# Patient Record
Sex: Female | Born: 1990 | Race: White | Hispanic: No | Marital: Single | Smoking: Never smoker
Health system: Southern US, Community
[De-identification: ages and names within clinical notes are randomized; demographics above are authoritative.]

## PROBLEM LIST (undated history)

## (undated) DIAGNOSIS — F419 Anxiety disorder, unspecified: Secondary | ICD-10-CM

## (undated) DIAGNOSIS — F32A Depression, unspecified: Secondary | ICD-10-CM

## (undated) HISTORY — PX: TONSILLECTOMY: SUR1361

---
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
## 2015-08-13 ENCOUNTER — Encounter: Payer: Self-pay | Admitting: Emergency Medicine

## 2015-08-13 ENCOUNTER — Emergency Department: Payer: Self-pay

## 2015-08-13 ENCOUNTER — Emergency Department
Admission: EM | Admit: 2015-08-13 | Discharge: 2015-08-13 | Disposition: A | Discharge status: Home / Self Care | Payer: Self-pay | Attending: Emergency Medicine | Admitting: Emergency Medicine

## 2015-08-13 DIAGNOSIS — M545 Low back pain, unspecified: Secondary | ICD-10-CM

## 2015-08-13 DIAGNOSIS — E669 Obesity, unspecified: Secondary | ICD-10-CM | POA: Insufficient documentation

## 2015-08-13 DIAGNOSIS — Z3202 Encounter for pregnancy test, result negative: Secondary | ICD-10-CM | POA: Insufficient documentation

## 2015-08-13 LAB — POCT PREGNANCY, URINE
PREG TEST UR: POSITIVE — AB
Preg Test, Ur: NEGATIVE

## 2015-08-13 MED ORDER — ORPHENADRINE CITRATE 30 MG/ML IJ SOLN
60.0000 mg | Freq: Two times a day (BID) | INTRAMUSCULAR | Status: DC
  Administered 2015-08-13: 60 mg via INTRAMUSCULAR
  Filled 2015-08-13: qty 2

## 2015-08-13 MED ORDER — CYCLOBENZAPRINE HCL 10 MG PO TABS: 10.0000 mg | ORAL_TABLET | Freq: Three times a day (TID) | ORAL | Status: DC | PRN

## 2015-08-13 MED ORDER — IBUPROFEN 800 MG PO TABS: 800.0000 mg | ORAL_TABLET | Freq: Three times a day (TID) | ORAL | Status: DC | PRN

## 2015-08-13 MED ORDER — HYDROMORPHONE HCL 1 MG/ML IJ SOLN
1.0000 mg | Freq: Once | INTRAMUSCULAR | Status: AC
  Administered 2015-08-13: 1 mg via INTRAMUSCULAR
  Filled 2015-08-13: qty 1

## 2015-08-13 NOTE — ED Notes (Signed)
NAD noted at time of D/C. Pt taken to the lobby via wheelchair by her mother. Pt denies comments/concerns at this time.

## 2015-08-13 NOTE — ED Notes (Signed)
Pt here for low back pain that started yesterday. Denies trauma.

## 2015-08-13 NOTE — ED Notes (Signed)
preg test on patient , test was neg.placed information in machine, machine turns off before completing  In system reads pos. Which is not true, so I Christy Robinson re-done test again and re-enter information once again, notified Nurse Tiburcio Bash. And Ron P.A.

## 2015-08-13 NOTE — ED Provider Notes (Signed)
[facility] Emergency Department Provider Note  ____________________________________________  Time seen: Approximately 7:18 AM  I have reviewed the triage vital signs and the nursing notes.   HISTORY  Chief Complaint Back Pain    HPI Christy Robinson is a 24 y.o. female complaining of acute onset of low back pain since yesterday. Patient stated no provocative activities caused this pain. Patient states she awakened with the pain. Patient denies any bladder or bowel dysfunction. Patient last visit. Finish 2 days ago. No palliative measures taken for this complaint.Patient rating the pain as a 6/10.   No past medical history on file.  There are no active problems to display for this patient.   History reviewed. No pertinent past surgical history.  Current Outpatient Rx  Name  Route  Sig  Dispense  Refill  . cyclobenzaprine (FLEXERIL) 10 MG tablet   Oral   Take 1 tablet (10 mg total) by mouth every 8 (eight) hours as needed for muscle spasms.   15 tablet   0   . ibuprofen (ADVIL,MOTRIN) 800 MG tablet   Oral   Take 1 tablet (800 mg total) by mouth every 8 (eight) hours as needed for moderate pain.   15 tablet   0     Allergies Augmentin  No family history on file.  Social History Social History  Substance Use Topics  . Smoking status: Never Smoker   . Smokeless tobacco: None  . Alcohol Use: None    Review of Systems Constitutional: No fever/chills Eyes: No visual changes. ENT: No sore throat. Cardiovascular: Denies chest pain. Respiratory: Denies shortness of breath. Gastrointestinal: No abdominal pain.  No nausea, no vomiting.  No diarrhea.  No constipation. Genitourinary: Negative for dysuria. Musculoskeletal: Positive back pain. Skin: Negative for rash. Neurological: Negative for headaches, focal weakness or numbness. Endocrine:Obesity Hematological/Lymphatic: Allergic/Immunilogical: Augmentin  10-point ROS otherwise  negative.  ____________________________________________   PHYSICAL EXAM:  VITAL SIGNS: ED Triage Vitals  Enc Vitals Group     BP 08/13/15 0705 116/89 mmHg     Pulse Rate 08/13/15 0705 80     Resp 08/13/15 0705 16     Temp 08/13/15 0705 97.5 F (36.4 C)     Temp Source 08/13/15 0705 Oral     SpO2 08/13/15 0705 89 %     Weight 08/13/15 0705 250 lb (113.399 kg)     Height --      Head Cir --      Peak Flow --      Pain Score 08/13/15 0706 6     Pain Loc --      Pain Edu? --      Excl. in GC? --     Constitutional: Alert and oriented. Well appearing and in no acute distress. Eyes: Conjunctivae are normal. PERRL. EOMI. Head: Atraumatic. Nose: No congestion/rhinnorhea. Mouth/Throat: Mucous membranes are moist.  Oropharynx non-erythematous. Neck: No stridor.  No cervical spine tenderness to palpation. Hematological/Lymphatic/Immunilogical: No cervical lymphadenopathy. Cardiovascular: Normal rate, regular rhythm. Grossly normal heart sounds.  Good peripheral circulation. Respiratory: Normal respiratory effort.  No retractions. Lungs CTAB. Gastrointestinal: Soft and nontender. No distention. No abdominal bruits. No CVA tenderness. }Musculoskeletal: No lower extremity tenderness nor edema.  No joint effusions. Neurologic: No spinal deformity. No CVA gotten. Patient has moderate guarding at spinal processes L4 and 5. Patient decreased range of motion with flexion. . Skin:  Skin is warm, dry and intact. No rash noted. Psychiatric: Mood and affect are normal. Speech and behavior  are normal.  ____________________________________________   LABS (all labs ordered are listed, but only abnormal results are displayed)  Labs Reviewed  POCT PREGNANCY, URINE - Abnormal; Notable for the following:    Preg Test, Ur POSITIVE (*)    All other components within normal limits  POC URINE PREG, ED    ____________________________________________  EKG   ____________________________________________  RADIOLOGY No acute findings on lumbar x-ray. I, Joni Reining, personally viewed and evaluated these images (plain radiographs) as part of my medical decision making.    ____________________________________________   PROCEDURES  Procedure(s) performed: None  Critical Care performed: No  ____________________________________________   INITIAL IMPRESSION / ASSESSMENT AND PLAN / ED COURSE  Pertinent labs & imaging results that were available during my care of the patient were reviewed by me and considered in my medical decision making (see chart for details). Acute low back pain. Discussed x-ray results with patient and mother. Patient will be given discharged home care instructions along with prescription for Flexeril and ibuprofen. Patient advised follow-up with PCP. ____________________________________________   FINAL CLINICAL IMPRESSION(S) / ED DIAGNOSES  Final diagnoses:  Back pain at L4-L5 level      Joni Reining, PA-C 08/13/15 0865  Arnaldo Natal, MD 08/13/15 [phone]

## 2017-01-09 ENCOUNTER — Encounter: Payer: Self-pay | Admitting: Emergency Medicine

## 2017-01-09 ENCOUNTER — Emergency Department
Admission: EM | Admit: 2017-01-09 | Discharge: 2017-01-09 | Disposition: A | Discharge status: Home / Self Care | Payer: Self-pay | Attending: Emergency Medicine | Admitting: Emergency Medicine

## 2017-01-09 DIAGNOSIS — Z79899 Other long term (current) drug therapy: Secondary | ICD-10-CM | POA: Insufficient documentation

## 2017-01-09 DIAGNOSIS — B349 Viral infection, unspecified: Secondary | ICD-10-CM | POA: Insufficient documentation

## 2017-01-09 MED ORDER — ONDANSETRON 4 MG PO TBDP: 4.0000 mg | ORAL_TABLET | Freq: Three times a day (TID) | ORAL | 0 refills | Status: DC | PRN

## 2017-01-09 MED ORDER — ONDANSETRON 4 MG PO TBDP
ORAL_TABLET | ORAL | Status: AC
  Administered 2017-01-09: 4 mg via ORAL
  Filled 2017-01-09: qty 1

## 2017-01-09 MED ORDER — BUTALBITAL-APAP-CAFFEINE 50-325-40 MG PO TABS
2.0000 | ORAL_TABLET | Freq: Once | ORAL | Status: AC
  Administered 2017-01-09: 2 via ORAL

## 2017-01-09 MED ORDER — ONDANSETRON 4 MG PO TBDP
4.0000 mg | ORAL_TABLET | Freq: Once | ORAL | Status: AC
  Administered 2017-01-09: 4 mg via ORAL

## 2017-01-09 MED ORDER — PROMETHAZINE HCL 25 MG PO TABS: 25.0000 mg | ORAL_TABLET | Freq: Four times a day (QID) | ORAL | 0 refills | Status: DC | PRN

## 2017-01-09 MED ORDER — BUTALBITAL-APAP-CAFFEINE 50-325-40 MG PO TABS
ORAL_TABLET | ORAL | Status: AC
  Filled 2017-01-09: qty 2

## 2017-01-09 NOTE — ED Provider Notes (Signed)
[facility] Emergency Department Provider Note  Time seen: 3:24 PM  I have reviewed the triage vital signs and the nursing notes.   HISTORY  Chief Complaint Generalized Body Aches; Nausea; Emesis; and Cough    HPI Pablo LedgerBrittany N Olds is a 26 y.o. female with no past medical history who presents the emergency department for cough, body aches, fever nausea and sore throat. According to the patient since last night she has been coughing but denies sputum production. Fever to 101 at home, states diffuse body aches nausea with intermittent episodes of vomiting. Patient states her father at home has the same symptoms. States some chest discomfort with cough but denies any currently. Denies any abdominal pain. Denies diarrhea.  History reviewed. No pertinent past medical history.  There are no active problems to display for this patient.   Past Surgical History:  Procedure Laterality Date  . TONSILLECTOMY      Prior to Admission medications   Medication Sig Start Date End Date Taking? Authorizing Provider  cyclobenzaprine (FLEXERIL) 10 MG tablet Take 1 tablet (10 mg total) by mouth every 8 (eight) hours as needed for muscle spasms. 08/13/15   Joni Reiningonald K Smith, PA-C  ibuprofen (ADVIL,MOTRIN) 800 MG tablet Take 1 tablet (800 mg total) by mouth every 8 (eight) hours as needed for moderate pain. 08/13/15   Joni Reiningonald K Smith, PA-C    Allergies  Allergen Reactions  . Augmentin [Amoxicillin-Pot Clavulanate] Hives    History reviewed. No pertinent family history.  Social History Social History  Substance Use Topics  . Smoking status: Never Smoker  . Smokeless tobacco: Not on file  . Alcohol use No    Review of Systems Constitutional: Positive for fever. ENT: Positive for congestion Cardiovascular: Negative for chest pain. Respiratory: Negative for shortness of breath. Positive for cough. Positive for sore throat. Gastrointestinal: Negative for abdominal pain.  Positive for nausea. Neurological: Negative for headache 10-point ROS otherwise negative.  ____________________________________________   PHYSICAL EXAM:  VITAL SIGNS: ED Triage Vitals  Enc Vitals Group     BP 01/09/17 1356 (!) 155/131     Pulse Rate 01/09/17 1356 (!) 111     Resp 01/09/17 1356 18     Temp 01/09/17 1356 98.3 F (36.8 C)     Temp src --      SpO2 01/09/17 1356 98 %     Weight 01/09/17 1356 250 lb (113.4 kg)     Height 01/09/17 1356 5\' 6"  (1.676 m)     Head Circumference --      Peak Flow --      Pain Score 01/09/17 1421 4     Pain Loc --      Pain Edu? --      Excl. in GC? --     Constitutional: Alert and oriented. Well appearing and in no distress. Eyes: Normal exam ENT   Head: Normocephalic and atraumatic.   Mouth/Throat: Mucous membranes are moist.Mild facial erythema. No exudate. No hypertrophy. Cardiovascular: Normal rate, regular , around 100 bpm. No murmur. Respiratory: Normal respiratory effort without tachypnea nor retractions. Breath sounds are clear  Gastrointestinal: Soft and nontender. No distention.   Musculoskeletal: Nontender with normal range of motion in all extremities. Neurologic:  Normal speech and language. No gross focal neurologic deficits Skin:  Skin is warm, dry and intact.  Psychiatric: Mood and affect are normal. Speech and behavior are normal.   ____________________________________________   INITIAL IMPRESSION / ASSESSMENT AND PLAN / ED COURSE  Pertinent labs & imaging results that were available during my care of the patient were reviewed by me and considered in my medical decision making (see chart for details).  Patient presents with an upper respiratory infection and fever, very suggestive of viral illness such as influenza. Overall the patient appears well, nontender abdomen, clear lung sounds. Patient appears to be suffering from an upper respiratory infection. We will treat with Zofran and Fioricet in the  emergency department. We'll attempt oral hydration. Lungs the patient is able to hydrate orally in the emergency department anticipate likely discharge home with supportive care and Zofran to be used as needed.  ____________________________________________   FINAL CLINICAL IMPRESSION(S) / ED DIAGNOSES  Viral illness    Minna Antis, MD 01/09/17 1527

## 2017-01-09 NOTE — ED Triage Notes (Signed)
Pt presents to ED c/o flu like symptoms body aches, nausea, cough, vomiting (liquid) since last night. Pt states she had a fever earlier today of 101.3; afebrile presently.

## 2017-01-09 NOTE — ED Notes (Signed)
AAOx3.  Skin warm and dry.  NAD 

## 2017-01-24 ENCOUNTER — Emergency Department: Payer: Self-pay

## 2017-01-24 ENCOUNTER — Encounter: Payer: Self-pay | Admitting: Emergency Medicine

## 2017-01-24 ENCOUNTER — Emergency Department
Admission: EM | Admit: 2017-01-24 | Discharge: 2017-01-24 | Disposition: A | Discharge status: Home / Self Care | Payer: Self-pay | Attending: Emergency Medicine | Admitting: Emergency Medicine

## 2017-01-24 DIAGNOSIS — M545 Low back pain: Secondary | ICD-10-CM | POA: Insufficient documentation

## 2017-01-24 DIAGNOSIS — Y999 Unspecified external cause status: Secondary | ICD-10-CM | POA: Insufficient documentation

## 2017-01-24 DIAGNOSIS — Y9289 Other specified places as the place of occurrence of the external cause: Secondary | ICD-10-CM | POA: Insufficient documentation

## 2017-01-24 DIAGNOSIS — Y939 Activity, unspecified: Secondary | ICD-10-CM | POA: Insufficient documentation

## 2017-01-24 DIAGNOSIS — M5432 Sciatica, left side: Secondary | ICD-10-CM

## 2017-01-24 DIAGNOSIS — M5442 Lumbago with sciatica, left side: Secondary | ICD-10-CM | POA: Insufficient documentation

## 2017-01-24 DIAGNOSIS — S39012A Strain of muscle, fascia and tendon of lower back, initial encounter: Secondary | ICD-10-CM | POA: Insufficient documentation

## 2017-01-24 DIAGNOSIS — X501XXA Overexertion from prolonged static or awkward postures, initial encounter: Secondary | ICD-10-CM | POA: Insufficient documentation

## 2017-01-24 DIAGNOSIS — M549 Dorsalgia, unspecified: Secondary | ICD-10-CM

## 2017-01-24 DIAGNOSIS — Z5321 Procedure and treatment not carried out due to patient leaving prior to being seen by health care provider: Secondary | ICD-10-CM | POA: Insufficient documentation

## 2017-01-24 MED ORDER — HYDROMORPHONE HCL 1 MG/ML IJ SOLN
1.0000 mg | Freq: Once | INTRAMUSCULAR | Status: AC
  Administered 2017-01-24: 1 mg via INTRAMUSCULAR
  Filled 2017-01-24: qty 1

## 2017-01-24 MED ORDER — ORPHENADRINE CITRATE 30 MG/ML IJ SOLN
60.0000 mg | Freq: Two times a day (BID) | INTRAMUSCULAR | Status: DC
  Administered 2017-01-24: 60 mg via INTRAMUSCULAR
  Filled 2017-01-24: qty 2

## 2017-01-24 MED ORDER — METHYLPREDNISOLONE 4 MG PO TBPK: ORAL_TABLET | ORAL | 0 refills | Status: DC

## 2017-01-24 MED ORDER — CYCLOBENZAPRINE HCL 10 MG PO TABS: 10.0000 mg | ORAL_TABLET | Freq: Three times a day (TID) | ORAL | 0 refills | Status: DC | PRN

## 2017-01-24 MED ORDER — METHYLPREDNISOLONE SODIUM SUCC 125 MG IJ SOLR
125.0000 mg | Freq: Once | INTRAMUSCULAR | Status: AC
  Administered 2017-01-24: 125 mg via INTRAMUSCULAR
  Filled 2017-01-24: qty 2

## 2017-01-24 MED ORDER — OXYCODONE-ACETAMINOPHEN 7.5-325 MG PO TABS: 1.0000 | ORAL_TABLET | Freq: Four times a day (QID) | ORAL | 0 refills | Status: DC | PRN

## 2017-01-24 NOTE — ED Triage Notes (Signed)
Pt presents to ED with c/o severe lower back pain that "shoots" down her left leg. Onset 2 days ago while twisting to get out of a van. Pt father states she heard a loud "pop" at onset of her pain and pt scramed loudly. He states it was so loud it sounded like she had been shot. Pt reports pain is worse with movement and she feels like she cant get up once sitting or lying down. Pt appears very uncomfortable. Pt reports the back pain is constant and her leg pain feels like it spasms.

## 2017-01-24 NOTE — ED Notes (Signed)
Father just came into waiting room, right up to stat registration; angry his daughter has been in the lobby for "4 hours"; began complaining loudly about the wait; he has gone out to get his Zena[employer] Niecevan and [facility]; explained to pt that I was going to take her to a room at this time but they have left the waiting room;

## 2017-01-24 NOTE — ED Provider Notes (Signed)
[facility] Emergency Department Provider Note   ____________________________________________   First MD Initiated Contact with Patient 01/24/17 1258     (approximate)  I have reviewed the triage vital signs and the nursing notes.   HISTORY  Chief Complaint Back Pain    HPI Christy Robinson is a 26 y.o. female patient complaining of radicular pain to the left lower extremity secondary to a twisting incident at the [place] 2 days ago.She denies bladder or bowel dysfunction. She rates the pain as a 10 over 10. Patient described a pain as sharp. Patient stated pain increases with standing and ambulating. He stated no relief using over-the-counter BC powders.   History reviewed. No pertinent past medical history.  There are no active problems to display for this patient.   Past Surgical History:  Procedure Laterality Date  . TONSILLECTOMY      Prior to Admission medications   Medication Sig Start Date End Date Taking? Authorizing Provider  cyclobenzaprine (FLEXERIL) 10 MG tablet Take 1 tablet (10 mg total) by mouth every 8 (eight) hours as needed for muscle spasms. 08/13/15   Joni Reining, PA-C  cyclobenzaprine (FLEXERIL) 10 MG tablet Take 1 tablet (10 mg total) by mouth 3 (three) times daily as needed. 01/24/17   Joni Reining, PA-C  ibuprofen (ADVIL,MOTRIN) 800 MG tablet Take 1 tablet (800 mg total) by mouth every 8 (eight) hours as needed for moderate pain. 08/13/15   Joni Reining, PA-C  methylPREDNISolone (MEDROL DOSEPAK) 4 MG TBPK tablet Take Tapered dose as directed 01/24/17   Joni Reining, PA-C  ondansetron (ZOFRAN ODT) 4 MG disintegrating tablet Take 1 tablet (4 mg total) by mouth every 8 (eight) hours as needed for nausea or vomiting. 01/09/17   Minna Antis, MD  oxyCODONE-acetaminophen (PERCOCET) 7.5-325 MG tablet Take 1 tablet by mouth every 6 (six) hours as needed for severe pain. 01/24/17   Joni Reining, PA-C  promethazine (PHENERGAN) 25  MG tablet Take 1 tablet (25 mg total) by mouth every 6 (six) hours as needed for nausea or vomiting. 01/09/17   Minna Antis, MD    Allergies Augmentin [amoxicillin-pot clavulanate]  No family history on file.  Social History Social History  Substance Use Topics  . Smoking status: Never Smoker  . Smokeless tobacco: Never Used  . Alcohol use No    Review of Systems Constitutional: No fever/chills Eyes: No visual changes. ENT: No sore throat. Cardiovascular: Denies chest pain. Respiratory: Denies shortness of breath. Gastrointestinal: No abdominal pain.  No nausea, no vomiting.  No diarrhea.  No constipation. Genitourinary: Negative for dysuria. Musculoskeletal: Negative for back pain. Skin: Negative for rash. Neurological: Negative for headaches, focal weakness or numbness. Allergic/Immunilogical: Augmentin  ____________________________________________   PHYSICAL EXAM:  VITAL SIGNS: ED Triage Vitals [01/24/17 1244]  Enc Vitals Group     BP      Pulse      Resp      Temp      Temp src      SpO2      Weight 250 lb (113.4 kg)     Height 5\' 6"  (1.676 m)     Head Circumference      Peak Flow      Pain Score 10     Pain Loc      Pain Edu?      Excl. in GC?     Constitutional: Alert and oriented. Well appearing and in no acute distress.Morbid obesity. Eyes:  Conjunctivae are normal. PERRL. EOMI. Head: Atraumatic. Nose: No congestion/rhinnorhea. Mouth/Throat: Mucous membranes are moist.  Oropharynx non-erythematous. Neck: No stridor.  No cervical spine tenderness to palpation. Hematological/Lymphatic/Immunilogical: No cervical lymphadenopathy. Cardiovascular: Normal rate, regular rhythm. Grossly normal heart sounds.  Good peripheral circulation. Respiratory: Normal respiratory effort.  No retractions. Lungs CTAB. Gastrointestinal: Soft and nontender. No distention. No abdominal bruits. No CVA tenderness. Musculoskeletal: No obvious spinal deformity. No guarding  palpation spinal processes. Patient has moderate guarding palpation of the paraspinal muscles. Decreased range of motion's all fields with bilateral muscle spasm. Patient Straight-leg test is negative. Neurologic:  Normal speech and language. No gross focal neurologic deficits are appreciated. No gait instability. Skin:  Skin is warm, dry and intact. No rash noted. Psychiatric: Mood and affect are normal. Speech and behavior are normal.  ____________________________________________   LABS (all labs ordered are listed, but only abnormal results are displayed)  Labs Reviewed - No data to display ____________________________________________  EKG   ____________________________________________  RADIOLOGY   ____________________________________________   PROCEDURES  Procedure(s) performed: None  Procedures  Critical Care performed: No  ____________________________________________   INITIAL IMPRESSION / ASSESSMENT AND PLAN / ED COURSE  Pertinent labs & imaging results that were available during my care of the patient were reviewed by me and considered in my medical decision making (see chart for details).  Radicular back pain. Patient given discharge Instructions. Patient given a prescription of Medrol Dosepak, Percocet, and Flexeril. Patient advised follow "clinic if condition persists.      ____________________________________________   FINAL CLINICAL IMPRESSION(S) / ED DIAGNOSES  Final diagnoses:  Sciatica of left side  Strain of lumbar region, initial encounter      NEW MEDICATIONS STARTED DURING THIS VISIT:  New Prescriptions   CYCLOBENZAPRINE (FLEXERIL) 10 MG TABLET    Take 1 tablet (10 mg total) by mouth 3 (three) times daily as needed.   METHYLPREDNISOLONE (MEDROL DOSEPAK) 4 MG TBPK TABLET    Take Tapered dose as directed   OXYCODONE-ACETAMINOPHEN (PERCOCET) 7.5-325 MG TABLET    Take 1 tablet by mouth every 6 (six) hours as needed for severe pain.      Note:  This document was prepared using Dragon voice recognition software and may include unintentional dictation errors.    Joni ReiningRonald K Smith, PA-C 01/24/17 1321    Nita Sicklearolina Veronese, MD 01/27/17 [phone]

## 2017-01-24 NOTE — ED Triage Notes (Signed)
Says back pain rad down left leg for 3 days.  pateint tearful.

## 2021-04-01 ENCOUNTER — Encounter: Payer: Self-pay | Admitting: Emergency Medicine

## 2021-04-01 ENCOUNTER — Emergency Department
Admission: EM | Admit: 2021-04-01 | Discharge: 2021-04-01 | Disposition: A | Discharge status: Home / Self Care | Payer: Self-pay | Attending: Emergency Medicine | Admitting: Emergency Medicine

## 2021-04-01 ENCOUNTER — Other Ambulatory Visit: Payer: Self-pay

## 2021-04-01 DIAGNOSIS — R197 Diarrhea, unspecified: Secondary | ICD-10-CM | POA: Insufficient documentation

## 2021-04-01 DIAGNOSIS — R1084 Generalized abdominal pain: Secondary | ICD-10-CM | POA: Insufficient documentation

## 2021-04-01 DIAGNOSIS — R112 Nausea with vomiting, unspecified: Secondary | ICD-10-CM | POA: Insufficient documentation

## 2021-04-01 HISTORY — DX: Depression, unspecified: F32.A

## 2021-04-01 HISTORY — DX: Anxiety disorder, unspecified: F41.9

## 2021-04-01 LAB — URINALYSIS, COMPLETE (UACMP) WITH MICROSCOPIC
Bacteria, UA: NONE SEEN
Bilirubin Urine: NEGATIVE
Glucose, UA: NEGATIVE mg/dL
Ketones, ur: NEGATIVE mg/dL
Leukocytes,Ua: NEGATIVE
Nitrite: NEGATIVE
Protein, ur: NEGATIVE mg/dL
Specific Gravity, Urine: 1.03 (ref 1.005–1.030)
pH: 5 (ref 5.0–8.0)

## 2021-04-01 LAB — CBC
HCT: 40.3 % (ref 36.0–46.0)
Hemoglobin: 12.1 g/dL (ref 12.0–15.0)
MCH: 22.7 pg — ABNORMAL LOW (ref 26.0–34.0)
MCHC: 30 g/dL (ref 30.0–36.0)
MCV: 75.6 fL — ABNORMAL LOW (ref 80.0–100.0)
Platelets: 273 10*3/uL (ref 150–400)
RBC: 5.33 MIL/uL — ABNORMAL HIGH (ref 3.87–5.11)
RDW: 15.5 % (ref 11.5–15.5)
WBC: 9.4 10*3/uL (ref 4.0–10.5)
nRBC: 0 % (ref 0.0–0.2)

## 2021-04-01 LAB — LIPASE, BLOOD: Lipase: 29 U/L (ref 11–51)

## 2021-04-01 LAB — COMPREHENSIVE METABOLIC PANEL
ALT: 29 U/L (ref 0–44)
AST: 30 U/L (ref 15–41)
Albumin: 4.3 g/dL (ref 3.5–5.0)
Alkaline Phosphatase: 87 U/L (ref 38–126)
Anion gap: 12 (ref 5–15)
BUN: 12 mg/dL (ref 6–20)
CO2: 23 mmol/L (ref 22–32)
Calcium: 9.2 mg/dL (ref 8.9–10.3)
Chloride: 104 mmol/L (ref 98–111)
Creatinine, Ser: 0.68 mg/dL (ref 0.44–1.00)
GFR, Estimated: >60 mL/min (ref >60)
Glucose, Bld: 99 mg/dL (ref 70–99)
Potassium: 3.2 mmol/L — ABNORMAL LOW (ref 3.5–5.1)
Sodium: 139 mmol/L (ref 135–145)
Total Bilirubin: 0.5 mg/dL (ref 0.3–1.2)
Total Protein: 8.5 g/dL — ABNORMAL HIGH (ref 6.5–8.1)

## 2021-04-01 LAB — POC URINE PREG, ED: Preg Test, Ur: NEGATIVE

## 2021-04-01 MED ORDER — DICYCLOMINE HCL 20 MG PO TABS: 20.0000 mg | ORAL_TABLET | Freq: Three times a day (TID) | ORAL | 0 refills | Status: DC | PRN

## 2021-04-01 MED ORDER — POTASSIUM CHLORIDE CRYS ER 20 MEQ PO TBCR
40.0000 meq | EXTENDED_RELEASE_TABLET | Freq: Once | ORAL | Status: AC
Start: 2021-04-01 — End: 2021-04-01
  Administered 2021-04-01: 40 meq via ORAL
  Filled 2021-04-01: qty 2

## 2021-04-01 MED ORDER — SODIUM CHLORIDE 0.9 % IV BOLUS (SEPSIS)
1000.0000 mL | Freq: Once | INTRAVENOUS | Status: AC
  Administered 2021-04-01: 1000 mL via INTRAVENOUS

## 2021-04-01 MED ORDER — ONDANSETRON 4 MG PO TBDP: 4.0000 mg | ORAL_TABLET | Freq: Four times a day (QID) | ORAL | 0 refills | Status: DC | PRN

## 2021-04-01 MED ORDER — KETOROLAC TROMETHAMINE 30 MG/ML IJ SOLN
30.0000 mg | Freq: Once | INTRAMUSCULAR | Status: AC
  Administered 2021-04-01: 30 mg via INTRAVENOUS
  Filled 2021-04-01: qty 1

## 2021-04-01 MED ORDER — ONDANSETRON HCL 4 MG/2ML IJ SOLN
4.0000 mg | Freq: Once | INTRAMUSCULAR | Status: AC
Start: 2021-04-01 — End: 2021-04-01
  Administered 2021-04-01: 4 mg via INTRAVENOUS
  Filled 2021-04-01: qty 2

## 2021-04-01 NOTE — ED Provider Notes (Signed)
[facility] Emergency Department Provider Note  ____________________________________________   Event Date/Time   First MD Initiated Contact with Patient 04/01/21 [phone]     (approximate)  I have reviewed the triage vital signs and the nursing notes.   HISTORY  Chief Complaint Abdominal Pain and Vomiting    HPI Christy Robinson is a 30 y.o. female with history of anxiety and depression who presents to the emergency department with complaints of nausea, vomiting and diarrhea that started 3 days ago.  No fevers.  Having diffuse abdominal cramping.  On her menstrual cycle currently.  Has had sick contacts with multiple people 4 days ago with similar symptoms.  No dysuria, hematuria, vaginal discharge.  No previous abdominal surgery.        Past Medical History:  Diagnosis Date  . Anxiety   . Depression     There are no problems to display for this patient.   Past Surgical History:  Procedure Laterality Date  . TONSILLECTOMY      Prior to Admission medications   Medication Sig Start Date End Date Taking? Authorizing Provider  dicyclomine (BENTYL) 20 MG tablet Take 1 tablet (20 mg total) by mouth every 8 (eight) hours as needed for spasms (Abdominal cramping). 04/01/21  Yes Nao Linz, Baxter Hire N, DO  ondansetron (ZOFRAN ODT) 4 MG disintegrating tablet Take 1 tablet (4 mg total) by mouth every 6 (six) hours as needed for nausea or vomiting. 04/01/21  Yes Shayanne Gomm, Layla Maw, DO  cyclobenzaprine (FLEXERIL) 10 MG tablet Take 1 tablet (10 mg total) by mouth every 8 (eight) hours as needed for muscle spasms. 08/13/15   Joni Reining, PA-C  cyclobenzaprine (FLEXERIL) 10 MG tablet Take 1 tablet (10 mg total) by mouth 3 (three) times daily as needed. 01/24/17   Joni Reining, PA-C  ibuprofen (ADVIL,MOTRIN) 800 MG tablet Take 1 tablet (800 mg total) by mouth every 8 (eight) hours as needed for moderate pain. 08/13/15   Joni Reining, PA-C  methylPREDNISolone (MEDROL  DOSEPAK) 4 MG TBPK tablet Take Tapered dose as directed 01/24/17   Joni Reining, PA-C  oxyCODONE-acetaminophen (PERCOCET) 7.5-325 MG tablet Take 1 tablet by mouth every 6 (six) hours as needed for severe pain. 01/24/17   Joni Reining, PA-C  promethazine (PHENERGAN) 25 MG tablet Take 1 tablet (25 mg total) by mouth every 6 (six) hours as needed for nausea or vomiting. 01/09/17   Minna Antis, MD    Allergies Augmentin [amoxicillin-pot clavulanate]  History reviewed. No pertinent family history.  Social History Social History   Tobacco Use  . Smoking status: Never Smoker  . Smokeless tobacco: Never Used  Substance Use Topics  . Alcohol use: No  . Drug use: No    Review of Systems Constitutional: No fever. Eyes: No visual changes. ENT: No sore throat. Cardiovascular: Denies chest pain. Respiratory: Denies shortness of breath. Gastrointestinal: + nausea, vomiting, diarrhea. Genitourinary: Negative for dysuria. Musculoskeletal: Negative for back pain. Skin: Negative for rash. Neurological: Negative for focal weakness or numbness.  ____________________________________________   PHYSICAL EXAM:  VITAL SIGNS: ED Triage Vitals  Enc Vitals Group     BP 04/01/21 0028 (!) 121/91     Pulse Rate 04/01/21 0028 (!) 110     Resp 04/01/21 0028 18     Temp 04/01/21 0028 97.7 F (36.5 C)     Temp Source 04/01/21 0028 Oral     SpO2 04/01/21 0028 100 %     Weight 04/01/21 0026  220 lb (99.8 kg)     Height 04/01/21 0026 5\' 6"  (1.676 m)     Head Circumference --      Peak Flow --      Pain Score 04/01/21 0025 8     Pain Loc --      Pain Edu? --      Excl. in GC? --    CONSTITUTIONAL: Alert and oriented and responds appropriately to questions. Well-appearing; well-nourished HEAD: Normocephalic EYES: Conjunctivae clear, pupils appear equal, EOM appear intact ENT: normal nose; moist mucous membranes NECK: Supple, normal ROM CARD: RRR; S1 and S2 appreciated; no murmurs, no  clicks, no rubs, no gallops RESP: Normal chest excursion without splinting or tachypnea; breath sounds clear and equal bilaterally; no wheezes, no rhonchi, no rales, no hypoxia or respiratory distress, speaking full sentences ABD/GI: Normal bowel sounds; non-distended; soft, non-tender, no rebound, no guarding, no peritoneal signs, no hepatosplenomegaly BACK: The back appears normal EXT: Normal ROM in all joints; no deformity noted, no edema; no cyanosis SKIN: Normal color for age and race; warm; no rash on exposed skin NEURO: Moves all extremities equally PSYCH: The patient's mood and manner are appropriate.  ____________________________________________   LABS (all labs ordered are listed, but only abnormal results are displayed)  Labs Reviewed  COMPREHENSIVE METABOLIC PANEL - Abnormal; Notable for the following components:      Result Value   Potassium 3.2 (*)    Total Protein 8.5 (*)    All other components within normal limits  CBC - Abnormal; Notable for the following components:   RBC 5.33 (*)    MCV 75.6 (*)    MCH 22.7 (*)    All other components within normal limits  URINALYSIS, COMPLETE (UACMP) WITH MICROSCOPIC - Abnormal; Notable for the following components:   Color, Urine YELLOW (*)    APPearance HAZY (*)    Hgb urine dipstick MODERATE (*)    All other components within normal limits  LIPASE, BLOOD  POC URINE PREG, ED   ____________________________________________  EKG   ____________________________________________  RADIOLOGY I, Gatlin Kittell, personally viewed and evaluated these images (plain radiographs) as part of my medical decision making, as well as reviewing the written report by the radiologist.  ED MD interpretation:    Official radiology report(s): No results found.  ____________________________________________   PROCEDURES  Procedure(s) performed (including Critical  Care):  Procedures   ____________________________________________   INITIAL IMPRESSION / ASSESSMENT AND PLAN / ED COURSE  As part of my medical decision making, I reviewed the following data within the electronic MEDICAL RECORD NUMBER Nursing notes reviewed and incorporated, Labs reviewed , Old chart reviewed and Notes from prior ED visits         Patient here with nausea, vomiting and diarrhea.  Suspect viral gastroenteritis.  Abdominal exam benign.  Doubt cholecystitis, pancreatitis, diverticulitis, appendicitis, bowel obstruction, perforation.  Labs, urine pending.  Will give IV fluids, Toradol, Zofran.  ED PROGRESS  Patient's labs are reassuring other than mildly low potassium level of 3.2.  Will give replacement.  Otherwise labs and urine unremarkable today.  Patient reports feeling much better and has been able to tolerate p.o.  I feel she is safe to be discharged home.  Will discharge with Bentyl, Zofran.  Recommended bland diet over the next several days.  At this time, I do not feel there is any life-threatening condition present. I have reviewed, interpreted and discussed all results (EKG, imaging, lab, urine as appropriate) and exam findings with  patient/family. I have reviewed nursing notes and appropriate previous records.  I feel the patient is safe to be discharged home without further emergent workup and can continue workup as an outpatient as needed. Discussed usual and customary return precautions. Patient/family verbalize understanding and are comfortable with this plan.  Outpatient follow-up has been provided as needed. All questions have been answered.  ____________________________________________   FINAL CLINICAL IMPRESSION(S) / ED DIAGNOSES  Final diagnoses:  Nausea vomiting and diarrhea     ED Discharge Orders         Ordered    dicyclomine (BENTYL) 20 MG tablet  Every 8 hours PRN        04/01/21 0258    ondansetron (ZOFRAN ODT) 4 MG disintegrating tablet   Every 6 hours PRN        04/01/21 0258          *Please note:  Christy Robinson was evaluated in Emergency Department on 04/01/2021 for the symptoms described in the history of present illness. She was evaluated in the context of the global COVID-19 pandemic, which necessitated consideration that the patient might be at risk for infection with the SARS-CoV-2 virus that causes COVID-19. Institutional protocols and algorithms that pertain to the evaluation of patients at risk for COVID-19 are in a state of rapid change based on information released by regulatory bodies including the CDC and federal and state organizations. These policies and algorithms were followed during the patient's care in the ED.  Some ED evaluations and interventions may be delayed as a result of limited staffing during and the pandemic.*   Note:  This document was prepared using Dragon voice recognition software and may include unintentional dictation errors.   Sheron Tallman, Layla Maw, DO 04/01/21 [phone]

## 2021-04-01 NOTE — ED Triage Notes (Signed)
Pt to ED from home c/o abd pain and n/v x3 days, states sinus congestion at home as well, denies fevers.  Pt states 3 episodes vomiting at home mainly after eating.  Pt A&Ox4, chest rise even and unlabored, in NAD at this time.

## 2021-04-01 NOTE — ED Notes (Signed)
Pt alert.  No n/v/d.  Family with pt.

## 2021-04-01 NOTE — ED Notes (Signed)
Report off to kendall rn

## 2021-04-01 NOTE — ED Notes (Signed)
Pt has n/v/d for 3 days with abd pain.  Diarrhea x 6 today.  Taking otc meds without relief.  No back pain.  Denies urinary sx.  Pt alert.  Iv in place.

## 2021-04-01 NOTE — ED Notes (Signed)
Pt given paper scrub pant to wear home per request

## 2021-04-01 NOTE — Discharge Instructions (Addendum)
You may alternate Tylenol 1000 mg every 6 hours as needed for pain, fever and Ibuprofen 800 mg every 8 hours as needed for pain, fever.  Please take Ibuprofen with food.  Do not take more than 4000 mg of Tylenol (acetaminophen) in a 24 hour period.  You may use over-the-counter Imodium as needed for diarrhea.   Steps to find a Primary Care Provider (PCP):  Call [phone] or [phone] to access "[place] Find a Doctor Service."  2.  You may also go on the [facility] website at [email or web address]

## 2021-07-03 ENCOUNTER — Other Ambulatory Visit: Payer: Self-pay

## 2021-07-03 ENCOUNTER — Emergency Department
Admission: EM | Admit: 2021-07-03 | Discharge: 2021-07-03 | Disposition: A | Discharge status: Home / Self Care | Payer: Self-pay | Attending: Emergency Medicine | Admitting: Emergency Medicine

## 2021-07-03 DIAGNOSIS — R519 Headache, unspecified: Secondary | ICD-10-CM | POA: Insufficient documentation

## 2021-07-03 DIAGNOSIS — R55 Syncope and collapse: Secondary | ICD-10-CM | POA: Insufficient documentation

## 2021-07-03 DIAGNOSIS — R079 Chest pain, unspecified: Secondary | ICD-10-CM | POA: Insufficient documentation

## 2021-07-03 DIAGNOSIS — Z5321 Procedure and treatment not carried out due to patient leaving prior to being seen by health care provider: Secondary | ICD-10-CM | POA: Insufficient documentation

## 2021-07-03 LAB — COMPREHENSIVE METABOLIC PANEL
ALT: 20 U/L (ref 0–44)
AST: 21 U/L (ref 15–41)
Albumin: 4.1 g/dL (ref 3.5–5.0)
Alkaline Phosphatase: 83 U/L (ref 38–126)
Anion gap: 6 (ref 5–15)
BUN: 12 mg/dL (ref 6–20)
CO2: 24 mmol/L (ref 22–32)
Calcium: 9.1 mg/dL (ref 8.9–10.3)
Chloride: 109 mmol/L (ref 98–111)
Creatinine, Ser: 0.89 mg/dL (ref 0.44–1.00)
GFR, Estimated: >60 mL/min (ref >60)
Glucose, Bld: 103 mg/dL — ABNORMAL HIGH (ref 70–99)
Potassium: 3.4 mmol/L — ABNORMAL LOW (ref 3.5–5.1)
Sodium: 139 mmol/L (ref 135–145)
Total Bilirubin: 0.4 mg/dL (ref 0.3–1.2)
Total Protein: 7.7 g/dL (ref 6.5–8.1)

## 2021-07-03 LAB — CBC
HCT: 38.2 % (ref 36.0–46.0)
Hemoglobin: 11.4 g/dL — ABNORMAL LOW (ref 12.0–15.0)
MCH: 22.9 pg — ABNORMAL LOW (ref 26.0–34.0)
MCHC: 29.8 g/dL — ABNORMAL LOW (ref 30.0–36.0)
MCV: 76.7 fL — ABNORMAL LOW (ref 80.0–100.0)
Platelets: 346 10*3/uL (ref 150–400)
RBC: 4.98 MIL/uL (ref 3.87–5.11)
RDW: 15.9 % — ABNORMAL HIGH (ref 11.5–15.5)
WBC: 10.3 10*3/uL (ref 4.0–10.5)
nRBC: 0 % (ref 0.0–0.2)

## 2021-07-03 LAB — TROPONIN I (HIGH SENSITIVITY): Troponin I (High Sensitivity): 3 ng/L (ref <18)

## 2021-07-03 NOTE — ED Notes (Signed)
Lab here for venipuncture.  

## 2021-07-03 NOTE — ED Notes (Signed)
Pt informed to front desk that she is leaving

## 2021-07-03 NOTE — ED Triage Notes (Signed)
Pt with syncopal episode at home today. Pt complains of chest pain and headache. Pt appears in no acute distress, normal ekg per ems with normal vs per ems.

## 2021-12-14 ENCOUNTER — Other Ambulatory Visit: Payer: Self-pay

## 2021-12-14 MED ORDER — FLUOXETINE HCL 20 MG PO CAPS
ORAL_CAPSULE | ORAL | 1 refills | Status: AC
  Filled 2021-12-14 – 2022-05-24 (×2): qty 30, 30d supply, fill #0

## 2021-12-15 ENCOUNTER — Other Ambulatory Visit: Payer: Self-pay

## 2022-01-04 ENCOUNTER — Emergency Department: Payer: Self-pay

## 2022-01-04 ENCOUNTER — Other Ambulatory Visit: Payer: Self-pay

## 2022-01-04 ENCOUNTER — Emergency Department
Admission: EM | Admit: 2022-01-04 | Discharge: 2022-01-04 | Disposition: A | Discharge status: Home / Self Care | Payer: Self-pay | Attending: Emergency Medicine | Admitting: Emergency Medicine

## 2022-01-04 DIAGNOSIS — M5416 Radiculopathy, lumbar region: Secondary | ICD-10-CM | POA: Insufficient documentation

## 2022-01-04 LAB — POC URINE PREG, ED: Preg Test, Ur: NEGATIVE

## 2022-01-04 MED ORDER — HYDROCODONE-ACETAMINOPHEN 5-325 MG PO TABS
1.0000 | ORAL_TABLET | Freq: Once | ORAL | Status: AC
  Administered 2022-01-04: 1 via ORAL
  Filled 2022-01-04: qty 1

## 2022-01-04 MED ORDER — HYDROCODONE-ACETAMINOPHEN 5-325 MG PO TABS: 1.0000 | ORAL_TABLET | ORAL | 0 refills | Status: AC | PRN

## 2022-01-04 MED ORDER — METHOCARBAMOL 500 MG PO TABS: 500.0000 mg | ORAL_TABLET | Freq: Four times a day (QID) | ORAL | 0 refills | Status: AC

## 2022-01-04 NOTE — ED Notes (Signed)
See triage note  presents with a 4 month hx of lower back pain  states pain is moving into left leg  ambulates with slight limp d/t pain

## 2022-01-04 NOTE — ED Provider Notes (Signed)
[facility] Provider Note    Event Date/Time   First MD Initiated Contact with Patient 01/04/22 1105     (approximate)   History   Chief Complaint Back Pain   HPI Christy Robinson is a 31 y.o. female, history of anxiety and depression, presents emergency department for evaluation of low back pain.  Patient states that this pain has been going on for the past 4 months.  Denies any preceding injuries.  She states that the pain has been gradually getting worse for the past few weeks.  Reports pain mostly in her lower spine with radiation down her left lower extremity.  She states that she has had pain like this before a few years ago, but it resolved on its own.  Denies fever/chills, weight loss, abdominal pain, chest pain, shortness of breath, nausea/vomiting, or urinary symptoms  History Limitations: No limitations      Physical Exam  Triage Vital Signs: ED Triage Vitals  Enc Vitals Group     BP      Pulse      Resp      Temp      Temp src      SpO2      Weight      Height      Head Circumference      Peak Flow      Pain Score      Pain Loc      Pain Edu?      Excl. in GC?     Most recent vital signs: Vitals:   01/04/22 1119  BP: 138/78  Pulse: 78  Resp: 20  Temp: 98.7 F (37.1 C)  SpO2: 98%    General: Awake, appears uncomfortable CV: Good peripheral perfusion.  Resp: Normal effort.  Abd: Soft, non-tender. No distention.  Neuro: At baseline.  No gross focal deficits. Other: Tenderness when palpating the lumbar spine.  Positive straight leg test.  Patient is able to ambulate across the room, though appears to be in significant pain.  Pulse, motor, sensation intact distally.  Physical Exam    ED Results / Procedures / Treatments  Labs (all labs ordered are listed, but only abnormal results are displayed) Labs Reviewed  POC URINE PREG, ED     EKG Not applicable.   RADIOLOGY  ED Provider Interpretation: I personally  reviewed and interpreted these images.  No acute fracture or dislocation.  DG Lumbar Spine Complete  Result Date: 01/04/2022 CLINICAL DATA:  Low back pain, radiating to left leg EXAM: LUMBAR SPINE - COMPLETE 4+ VIEW COMPARISON:  01/24/2017 FINDINGS: No fracture or dislocation of the lumbar spine. Gentle levoscoliosis, apex L2-L3. Focally mild disc space height loss and osteophytosis at L4-L5, with otherwise preserved disc spaces and minimal endplate osteophytosis throughout. Facets are intact. Nonobstructive pattern of overlying bowel gas. IMPRESSION: 1.  No fracture or dislocation of the lumbar spine. 2.  Gentle levoscoliosis, apex L2-L3. 3. Focally mild disc space height loss and osteophytosis at L4-L5, new compared to prior examination dated 2018, with otherwise preserved disc spaces and minimal endplate osteophytosis throughout. Lumbar disc and neural foraminal pathology may be further evaluated by MRI if indicated by neurologically localizing signs and symptoms. Electronically Signed   By: Jearld Lesch M.D.   On: 01/04/2022 13:05    PROCEDURES:  Critical Care performed: None.  Procedures    MEDICATIONS ORDERED IN ED: Medications  HYDROcodone-acetaminophen (NORCO/VICODIN) 5-325 MG per tablet 1 tablet (1 tablet Oral Given  01/04/22 1205)     IMPRESSION / MDM / ASSESSMENT AND PLAN / ED COURSE  I reviewed the triage vital signs and the nursing notes.                              Christy Robinson is a 31 y.o. female, history of anxiety and depression, presents emergency department for evaluation of low back pain.  Patient states that this pain has been going on for the past 4 months.    Differential diagnosis includes, but is not limited to, lumbar radiculopathy, sciatica, muscle strain, malignancy, spinal fracture.   ED Course Patient is stable.  Vital signs within normal limits.  We will go ahead and treat patient's pain now with hydrocodone/acetaminophen.  Lumbar x-ray shows no  evidence of acute fractures or dislocations, though does shows mild disc space height loss and osteophytosis at L4-L5, that is new since her last imaging in 2018.  Assessment/Plan Presentation consistent with lumbar radiculopathy, likely secondary to new osteophytosis at L4-L5.  No acute fracture or dislocation.  No evidence of malignancy.  Low suspicion for cauda equina given no endorsement of urinary incontinence or saddle anesthesia. We will plan to discharge this patient with methocarbamol and short-term supply of hydrocodone/acetaminophen to be used as needed.  Will provider with a referral to orthopedics for further work-up.  No indication for further treatment or work-up in the emergency department at this time.  We will plan to discharge this patient.  Patient was provided with anticipatory guidance, return precautions, and educational material. Encouraged the patient to return to the emergency department at any time if they begin to experience any new or worsening symptoms.       FINAL CLINICAL IMPRESSION(S) / ED DIAGNOSES   Final diagnoses:  Lumbar radiculopathy     Rx / DC Orders   ED Discharge Orders          Ordered    methocarbamol (ROBAXIN) 500 MG tablet  4 times daily        01/04/22 1327    HYDROcodone-acetaminophen (NORCO/VICODIN) 5-325 MG tablet  Every 4 hours PRN        01/04/22 1327             Note:  This document was prepared using Dragon voice recognition software and may include unintentional dictation errors.   Varney Daily, [place] 01/04/22 1329    Georga Hacking, MD 01/04/22 [phone]

## 2022-01-04 NOTE — ED Triage Notes (Signed)
Pt c/o lower back  pain for the past 4 mo, states it just keeps getting worse.

## 2022-01-04 NOTE — Discharge Instructions (Addendum)
-  Take your medications as prescribed.  Supplement with Tylenol/ibuprofen as needed.  Use hydrocodone sparingly. -Follow-up with the orthopedist listed above. -Return to the emergency department anytime if you begin to experience any new or worsening symptoms.

## 2022-01-12 ENCOUNTER — Other Ambulatory Visit: Payer: Self-pay

## 2022-01-15 ENCOUNTER — Other Ambulatory Visit: Payer: Self-pay

## 2022-02-17 ENCOUNTER — Other Ambulatory Visit: Payer: Self-pay

## 2022-05-10 ENCOUNTER — Emergency Department
Admission: EM | Admit: 2022-05-10 | Discharge: 2022-05-10 | Disposition: A | Discharge status: Home / Self Care | Payer: 59 | Attending: Emergency Medicine | Admitting: Emergency Medicine

## 2022-05-10 ENCOUNTER — Emergency Department: Payer: 59

## 2022-05-10 ENCOUNTER — Other Ambulatory Visit: Payer: Self-pay

## 2022-05-10 DIAGNOSIS — G8929 Other chronic pain: Secondary | ICD-10-CM | POA: Insufficient documentation

## 2022-05-10 DIAGNOSIS — M546 Pain in thoracic spine: Secondary | ICD-10-CM | POA: Diagnosis not present

## 2022-05-10 DIAGNOSIS — M545 Low back pain, unspecified: Secondary | ICD-10-CM | POA: Insufficient documentation

## 2022-05-10 MED ORDER — MELOXICAM 15 MG PO TABS: 15.0000 mg | ORAL_TABLET | Freq: Every day | ORAL | 0 refills | Status: DC

## 2022-05-10 NOTE — ED Provider Notes (Signed)
[facility] Provider Note    Event Date/Time   First MD Initiated Contact with Patient 05/10/22 1311     (approximate)   History   Back Pain   HPI  Christy Robinson is a 31 y.o. female   presents today with complaint of mid upper back pain that started several weeks ago.  Patient denies any recent injury.  She states that she was seen in January for low back pain at which time she was told that she has sciatica.  She was discharged with a prescription for a muscle relaxant and has been taking over-the-counter medication with out complete relief of her pain.  She denies any urinary symptoms or history of stones.  Patient has a history of anxiety and depression and is a non-smoker.      Physical Exam   Triage Vital Signs: ED Triage Vitals  Enc Vitals Group     BP 05/10/22 1225 (!) 156/109     Pulse Rate 05/10/22 1225 (!) 109     Resp 05/10/22 1225 18     Temp 05/10/22 1225 98 F (36.7 C)     Temp src --      SpO2 05/10/22 1225 95 %     Weight 05/10/22 1312 199 lb 15.3 oz (90.7 kg)     Height 05/10/22 1312 5\' 6"  (1.676 m)     Head Circumference --      Peak Flow --      Pain Score 05/10/22 1224 10     Pain Loc --      Pain Edu? --      Excl. in GC? --     Most recent vital signs: Vitals:   05/10/22 1225  BP: (!) 156/109  Pulse: (!) 109  Resp: 18  Temp: 98 F (36.7 C)  SpO2: 95%     General: Awake, no distress.  CV:  Good peripheral perfusion.  Heart regular rate and rhythm. Resp:  Normal effort.  Lungs are clear bilaterally. Abd:  No distention.  Soft, nontender, bowel sounds normoactive x4 quadrants.  No CVA tenderness appreciated. Other:  Good muscle strength bilaterally at 5/5 and patient is ambulatory without assistance.  There is some tenderness on palpation of T10, T11, T12 area.  No evidence of injury, soft tissue edema or discoloration present.   ED Results / Procedures / Treatments   Labs (all labs ordered are listed,  but only abnormal results are displayed) Labs Reviewed - No data to display    RADIOLOGY  Thoracic spine x-ray images were reviewed by myself independent of the radiologist and a curvature was noted.  No acute fracture.  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   PROCEDURES:  Critical Care performed:   Procedures   MEDICATIONS ORDERED IN ED: Medications - No data to display   IMPRESSION / MDM / ASSESSMENT AND PLAN / ED COURSE  I reviewed the triage vital signs and the nursing notes.   Differential diagnosis includes, but is not limited to, acute exacerbation of back pain, muscle skeletal pain, thoracic compression fracture, acute muscle spasms.  31 year old female presents to the ED with complaint of low back pain which started at a young age without history of injury.  Patient has continued to have both low back pain and was seen at the emergency department where she was diagnosed with having's scoliosis and left leg radiculopathy/sciatica.  Patient is here today as her back pain is higher and again without any  injury known.  She denies any urinary symptoms, hematuria or paresthesias.  She also denies any incontinence of bowel or bladder.  Patient currently takes muscle relaxants which she has at home as needed for moderate pain and when she cannot get comfortable.  She states that she tried to call and make an appointment with orthopedist but this is not covered with her Medicaid.  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  Patient was made aware and recommended to call the [facility] spine and scoliosis center to see if she is able to be seen there.  She has muscle relaxants at home and we will add meloxicam to her daily medications.  We also discussed over-the-counter medications such as Biofreeze which may give her temporary relief and also using ice or heat.      Patient's presentation is  most consistent with exacerbation of chronic illness.  FINAL CLINICAL IMPRESSION(S) / ED DIAGNOSES   Final diagnoses:  Acute exacerbation of chronic low back pain  Acute midline thoracic back pain     Rx / DC Orders   ED Discharge Orders          Ordered    meloxicam (MOBIC) 15 MG tablet  Daily        05/10/22 1433             Note:  This document was prepared using Dragon voice recognition software and may include unintentional dictation errors.   Tommi Rumps, PA-C 05/10/22 1457    Jene Every, MD 05/10/22 [phone]

## 2022-05-10 NOTE — Discharge Instructions (Addendum)
Call make an appointment with the [facility] spine and scoliosis center.  Continue to take the muscle relaxant and also begin taking meloxicam 1 daily with food.  You may take Tylenol with this medication if additional pain medication is needed.  Also may use ice or heat to your back as needed for discomfort.  Biofreeze cream over-the-counter may give you temporary relief if you are having severe pain.

## 2022-05-10 NOTE — ED Triage Notes (Signed)
Pt come with c/o mid upper back pain the patient states this has been ongoing but now pain is unbearable .

## 2022-05-24 ENCOUNTER — Other Ambulatory Visit: Payer: Self-pay

## 2022-10-19 ENCOUNTER — Other Ambulatory Visit: Payer: Self-pay

## 2022-10-19 ENCOUNTER — Emergency Department
Admission: EM | Admit: 2022-10-19 | Discharge: 2022-10-19 | Disposition: A | Discharge status: Home / Self Care | Payer: Medicaid Other | Attending: Emergency Medicine | Admitting: Emergency Medicine

## 2022-10-19 ENCOUNTER — Emergency Department: Payer: Medicaid Other

## 2022-10-19 ENCOUNTER — Encounter: Payer: Self-pay | Admitting: Emergency Medicine

## 2022-10-19 DIAGNOSIS — S8011XA Contusion of right lower leg, initial encounter: Secondary | ICD-10-CM

## 2022-10-19 DIAGNOSIS — W010XXA Fall on same level from slipping, tripping and stumbling without subsequent striking against object, initial encounter: Secondary | ICD-10-CM | POA: Insufficient documentation

## 2022-10-19 DIAGNOSIS — S8001XA Contusion of right knee, initial encounter: Secondary | ICD-10-CM | POA: Insufficient documentation

## 2022-10-19 MED ORDER — MELOXICAM 15 MG PO TABS: 15.0000 mg | ORAL_TABLET | Freq: Every day | ORAL | 0 refills | Status: AC

## 2022-10-19 NOTE — Discharge Instructions (Addendum)
Follow-up with your primary care provider or call make an appointment at [facility] orthopedic department if any continued problems or concerns.  You can still use ice as needed for pain.  A prescription for meloxicam was sent to the pharmacy to take daily with food until your right leg is better.

## 2022-10-19 NOTE — ED Provider Notes (Signed)
[facility] Provider Note    Event Date/Time   First MD Initiated Contact with Patient 10/19/22 1225     (approximate)   History   Knee Pain   HPI  Christy Robinson is a 31 y.o. female   presents to the ED with complaint of right knee pain.  Patient states that she fell over 1 days ago walking her dog across asphalt and landed on the.  Patient states that just below her knee has been on and bruising is slowly disappearing.  No over-the-counter medications have been taken.  Patient continues to ambulate without any assistance.  Has a history of anxiety and depression.      Physical Exam   Triage Vital Signs: ED Triage Vitals  Enc Vitals Group     BP 10/19/22 1203 (!) 150/84     Pulse Rate 10/19/22 1203 88     Resp 10/19/22 1203 18     Temp 10/19/22 1203 98 F (36.7 C)     Temp Source 10/19/22 1203 Oral     SpO2 10/19/22 1203 96 %     Weight 10/19/22 1204 290 lb (131.5 kg)     Height 10/19/22 1204 5\' 6"  (1.676 m)     Head Circumference --      Peak Flow --      Pain Score 10/19/22 1208 6     Pain Loc --      Pain Edu? --      Excl. in [place]? --     Most recent vital signs: Vitals:   10/19/22 1203  BP: (!) 150/84  Pulse: 88  Resp: 18  Temp: 98 F (36.7 C)  SpO2: 96%     General: Awake, no distress.  CV:  Good peripheral perfusion.  Resp:  Normal effort.  Abd:  No distention.  Other:  Right knee without deformity or effusion.  Slowly resolving ecchymosis noted just below the patella.  Range of motion is slow but patient is able to stand and ambulate without any assistance.  Skin is intact.   ED Results / Procedures / Treatments   Labs (all labs ordered are listed, but only abnormal results are displayed) Labs Reviewed - No data to display     RADIOLOGY Right knee x-ray report was not seen prior to discharge due to radiology having x-ray images locked.  X-ray report was read over the phone.    PROCEDURES:  Critical Care  performed:   Procedures   MEDICATIONS ORDERED IN ED: Medications - No data to display   IMPRESSION / MDM / ASSESSMENT AND PLAN / ED COURSE  I reviewed the triage vital signs and the nursing notes.   Differential diagnosis includes, but is not limited to, contusion right knee, fracture, dislocated patella, blunt injury soft tissue right lower extremity.  30 year old female presents to the ED with complaint of right knee pain for the last 11 days.  Patient began having pain after she fell while walking the dog across some asphalt.  Physical exam was benign with the exception of some soft tissue tenderness and low suspicion for fracture.  X-rays were obtained and patient was reassured that there was no fracture or dislocation.  Patient was made aware that an anti-inflammatory such as meloxicam which she has taken in the past would help with this.  She is encouraged to use ice and elevate as needed for pain or swelling.  She is to follow-up with her PCP or [facility] orthopedic  department if any long-term continued problems.      Patient's presentation is most consistent with acute complicated illness / injury requiring diagnostic workup.  FINAL CLINICAL IMPRESSION(S) / ED DIAGNOSES   Final diagnoses:  Contusion of right knee and lower leg, initial encounter     Rx / DC Orders   ED Discharge Orders          Ordered    meloxicam (MOBIC) 15 MG tablet  Daily        10/19/22 1428             Note:  This document was prepared using Dragon voice recognition software and may include unintentional dictation errors.   Tommi Rumps, PA-C 10/19/22 1437    Jene Every, MD 10/19/22 1438

## 2022-10-19 NOTE — ED Triage Notes (Signed)
Patient to ED for right knee pain. Patient injured it 11 days ago after falling when walking dog. Per patient, bruise noted on knee and is numb on knee cap. Ambulatory to triage.

## 2022-11-01 ENCOUNTER — Other Ambulatory Visit (HOSPITAL_BASED_OUTPATIENT_CLINIC_OR_DEPARTMENT_OTHER): Payer: Self-pay

## 2022-12-20 ENCOUNTER — Encounter: Payer: Self-pay | Admitting: Emergency Medicine

## 2022-12-20 ENCOUNTER — Emergency Department
Admission: EM | Admit: 2022-12-20 | Discharge: 2022-12-20 | Disposition: A | Discharge status: Home / Self Care | Payer: Medicaid Other | Attending: Student in an Organized Health Care Education/Training Program | Admitting: Student in an Organized Health Care Education/Training Program

## 2022-12-20 ENCOUNTER — Emergency Department: Payer: Medicaid Other

## 2022-12-20 ENCOUNTER — Other Ambulatory Visit: Payer: Self-pay

## 2022-12-20 DIAGNOSIS — Z1152 Encounter for screening for COVID-19: Secondary | ICD-10-CM | POA: Diagnosis not present

## 2022-12-20 DIAGNOSIS — J181 Lobar pneumonia, unspecified organism: Secondary | ICD-10-CM | POA: Diagnosis not present

## 2022-12-20 DIAGNOSIS — J101 Influenza due to other identified influenza virus with other respiratory manifestations: Secondary | ICD-10-CM | POA: Diagnosis not present

## 2022-12-20 DIAGNOSIS — M791 Myalgia, unspecified site: Secondary | ICD-10-CM | POA: Diagnosis not present

## 2022-12-20 DIAGNOSIS — R059 Cough, unspecified: Secondary | ICD-10-CM | POA: Diagnosis present

## 2022-12-20 DIAGNOSIS — J189 Pneumonia, unspecified organism: Secondary | ICD-10-CM

## 2022-12-20 LAB — CBC
HCT: 39.5 % (ref 36.0–46.0)
Hemoglobin: 11.7 g/dL — ABNORMAL LOW (ref 12.0–15.0)
MCH: 22.6 pg — ABNORMAL LOW (ref 26.0–34.0)
MCHC: 29.6 g/dL — ABNORMAL LOW (ref 30.0–36.0)
MCV: 76.3 fL — ABNORMAL LOW (ref 80.0–100.0)
Platelets: 244 10*3/uL (ref 150–400)
RBC: 5.18 MIL/uL — ABNORMAL HIGH (ref 3.87–5.11)
RDW: 16.3 % — ABNORMAL HIGH (ref 11.5–15.5)
WBC: 4.9 10*3/uL (ref 4.0–10.5)
nRBC: 0 % (ref 0.0–0.2)

## 2022-12-20 LAB — BASIC METABOLIC PANEL
Anion gap: 12 (ref 5–15)
BUN: 11 mg/dL (ref 6–20)
CO2: 21 mmol/L — ABNORMAL LOW (ref 22–32)
Calcium: 8.7 mg/dL — ABNORMAL LOW (ref 8.9–10.3)
Chloride: 103 mmol/L (ref 98–111)
Creatinine, Ser: 0.64 mg/dL (ref 0.44–1.00)
GFR, Estimated: >60 mL/min (ref >60)
Glucose, Bld: 98 mg/dL (ref 70–99)
Potassium: 3.5 mmol/L (ref 3.5–5.1)
Sodium: 136 mmol/L (ref 135–145)

## 2022-12-20 LAB — RESP PANEL BY RT-PCR (RSV, FLU A&B, COVID)  RVPGX2
Influenza A by PCR: NEGATIVE
Influenza B by PCR: POSITIVE — AB
Resp Syncytial Virus by PCR: NEGATIVE
SARS Coronavirus 2 by RT PCR: NEGATIVE

## 2022-12-20 MED ORDER — ONDANSETRON 4 MG PO TBDP: 4.0000 mg | ORAL_TABLET | Freq: Three times a day (TID) | ORAL | 0 refills | Status: AC | PRN

## 2022-12-20 MED ORDER — AZITHROMYCIN 250 MG PO TABS: ORAL_TABLET | ORAL | 0 refills | Status: DC

## 2022-12-20 NOTE — ED Notes (Signed)
RN collected labs. Pt is a very difficult stick and was stuck two times with this RN and one time with a paramedic.

## 2022-12-20 NOTE — Discharge Instructions (Signed)
Take the antibiotic as directed and the nausea medicine as needed.  Follow-up with primary provider for ongoing symptoms.  Take OTC medicines as needed for cough relief including Delsym cough syrup.

## 2022-12-20 NOTE — ED Triage Notes (Signed)
Pt sts that the other day she was standing out in the rain cause she was waiting for the bus. Pt sts that she has been coughing ever since than and now what she coughs she is having sharps musculoskeletal chest pain.

## 2022-12-20 NOTE — ED Provider Notes (Signed)
[facility] Emergency Department Provider Note     Event Date/Time   First MD Initiated Contact with Patient 12/20/22 1837     (approximate)   History   Cough   HPI  Christy Robinson is a 32 y.o. female with a noncontributory medical history, presents to the ED for evaluation of cough with musculoskeletal chest pain associated with it.  She reports onset of this she stayed outside in the rain waiting for the bus.  She denies any frank fevers, chills, or sweats.  Patient also denies any NVD, hemoptysis, or substernal chest pain.   Physical Exam   Triage Vital Signs: ED Triage Vitals  Enc Vitals Group     BP 12/20/22 1803 109/75     Pulse Rate 12/20/22 1803 (!) 113     Resp 12/20/22 1803 19     Temp 12/20/22 1803 98.6 F (37 C)     Temp Source 12/20/22 1803 Oral     SpO2 12/20/22 1803 94 %     Weight 12/20/22 1803 280 lb (127 kg)     Height 12/20/22 1841 5\' 6"  (1.676 m)     Head Circumference --      Peak Flow --      Pain Score 12/20/22 1803 6     Pain Loc --      Pain Edu? --      Excl. in [place]? --     Most recent vital signs: Vitals:   12/20/22 1803 12/20/22 2025  BP: 109/75 124/76  Pulse: (!) 113 92  Resp: 19 18  Temp: 98.6 F (37 C) 99.8 F (37.7 C)  SpO2: 94% 96%    General Awake, no distress. NAD HEENT NCAT. PERRL. EOMI. No rhinorrhea. Mucous membranes are moist.  CV:  Good peripheral perfusion.  RESP:  Normal effort.  ABD:  No distention.    ED Results / Procedures / Treatments   Labs (all labs ordered are listed, but only abnormal results are displayed) Labs Reviewed  RESP PANEL BY RT-PCR (RSV, FLU A&B, COVID)  RVPGX2 - Abnormal; Notable for the following components:      Result Value   Influenza B by PCR POSITIVE (*)    All other components within normal limits  BASIC METABOLIC PANEL - Abnormal; Notable for the following components:   CO2 21 (*)    Calcium 8.7 (*)    All other components within normal limits   CBC - Abnormal; Notable for the following components:   RBC 5.18 (*)    Hemoglobin 11.7 (*)    MCV 76.3 (*)    MCH 22.6 (*)    MCHC 29.6 (*)    RDW 16.3 (*)    All other components within normal limits     EKG    RADIOLOGY  I personally viewed and evaluated these images as part of my medical decision making, as well as reviewing the written report by the radiologist.  ED Provider Interpretation: early LLL consolidation  DG Chest 2 View  Result Date: 12/20/2022 CLINICAL DATA:  Cough and chest pain for several days, initial encounter EXAM: CHEST - 2 VIEW COMPARISON:  05/25/2011 FINDINGS: Cardiac shadow is within normal limits. Lungs are well aerated bilaterally. Patchy left basilar opacity is noted projecting in the lower lobe likely related atelectasis or early infiltrate. No other focal abnormality is seen. IMPRESSION: Mild increased airspace opacity in the left base. Electronically Signed   By: Linus Mako.D.  On: 12/20/2022 19:29     PROCEDURES:  Critical Care performed: No  Procedures   MEDICATIONS ORDERED IN ED: Medications - No data to display   IMPRESSION / MDM / [place] / ED COURSE  I reviewed the triage vital signs and the nursing notes.                              Differential diagnosis includes, but is not limited to, COVID, flu, RSV, CAP, viral URI, bronchitis  Patient's presentation is most consistent with acute complicated illness / injury requiring diagnostic workup.  Patient's diagnosis is consistent with left lower lobe pneumonia and influenza. Patient will be discharged home with prescriptions for azithromycin & Zofran. Patient is to follow up with primary provider as needed or otherwise directed. Patient is given ED precautions to return to the ED for any worsening or new symptoms.     FINAL CLINICAL IMPRESSION(S) / ED DIAGNOSES   Final diagnoses:  Community acquired pneumonia of left lower lobe of lung  Influenza B     Rx  / DC Orders   ED Discharge Orders          Ordered    ondansetron (ZOFRAN-ODT) 4 MG disintegrating tablet  Every 8 hours PRN        12/20/22 2007    azithromycin (ZITHROMAX Z-PAK) 250 MG tablet        12/20/22 2007             Note:  This document was prepared using Dragon voice recognition software and may include unintentional dictation errors.    Melvenia Needles, PA-C 12/20/22 2352    Merlyn Lot, MD 12/21/22 [phone]

## 2023-07-20 ENCOUNTER — Other Ambulatory Visit
Admission: RE | Admit: 2023-07-20 | Discharge: 2023-07-20 | Disposition: A | Discharge status: Home / Self Care | Payer: Medicaid Other | Source: Ambulatory Visit | Attending: Family Medicine | Admitting: Family Medicine

## 2023-07-20 DIAGNOSIS — F310 Bipolar disorder, current episode hypomanic: Secondary | ICD-10-CM | POA: Diagnosis not present

## 2023-07-20 DIAGNOSIS — E669 Obesity, unspecified: Secondary | ICD-10-CM | POA: Insufficient documentation

## 2023-07-20 DIAGNOSIS — N920 Excessive and frequent menstruation with regular cycle: Secondary | ICD-10-CM | POA: Insufficient documentation

## 2023-07-20 DIAGNOSIS — Z1159 Encounter for screening for other viral diseases: Secondary | ICD-10-CM | POA: Diagnosis present

## 2023-07-20 LAB — HIV ANTIBODY (ROUTINE TESTING W REFLEX): HIV Screen 4th Generation wRfx: NONREACTIVE

## 2023-07-20 LAB — COMPREHENSIVE METABOLIC PANEL
ALT: 27 U/L (ref 0–44)
AST: 22 U/L (ref 15–41)
Albumin: 3.9 g/dL (ref 3.5–5.0)
Alkaline Phosphatase: 89 U/L (ref 38–126)
Anion gap: 7 (ref 5–15)
BUN: 14 mg/dL (ref 6–20)
CO2: 28 mmol/L (ref 22–32)
Calcium: 9.1 mg/dL (ref 8.9–10.3)
Chloride: 105 mmol/L (ref 98–111)
Creatinine, Ser: 0.77 mg/dL (ref 0.44–1.00)
GFR, Estimated: >60 mL/min (ref >60)
Glucose, Bld: 105 mg/dL — ABNORMAL HIGH (ref 70–99)
Potassium: 3.6 mmol/L (ref 3.5–5.1)
Sodium: 140 mmol/L (ref 135–145)
Total Bilirubin: 0.5 mg/dL (ref 0.3–1.2)
Total Protein: 7.5 g/dL (ref 6.5–8.1)

## 2023-07-20 LAB — CBC WITH DIFFERENTIAL/PLATELET
Abs Immature Granulocytes: 0.01 10*3/uL (ref 0.00–0.07)
Basophils Absolute: 0 10*3/uL (ref 0.0–0.1)
Basophils Relative: 0 %
Eosinophils Absolute: 0.1 10*3/uL (ref 0.0–0.5)
Eosinophils Relative: 2 %
HCT: 39 % (ref 36.0–46.0)
Hemoglobin: 11.6 g/dL — ABNORMAL LOW (ref 12.0–15.0)
Immature Granulocytes: 0 %
Lymphocytes Relative: 36 %
Lymphs Abs: 2.6 10*3/uL (ref 0.7–4.0)
MCH: 22.9 pg — ABNORMAL LOW (ref 26.0–34.0)
MCHC: 29.7 g/dL — ABNORMAL LOW (ref 30.0–36.0)
MCV: 77.1 fL — ABNORMAL LOW (ref 80.0–100.0)
Monocytes Absolute: 0.4 10*3/uL (ref 0.1–1.0)
Monocytes Relative: 5 %
Neutro Abs: 4.2 10*3/uL (ref 1.7–7.7)
Neutrophils Relative %: 57 %
Platelets: 326 10*3/uL (ref 150–400)
RBC: 5.06 MIL/uL (ref 3.87–5.11)
RDW: 16.6 % — ABNORMAL HIGH (ref 11.5–15.5)
WBC: 7.3 10*3/uL (ref 4.0–10.5)
nRBC: 0 % (ref 0.0–0.2)

## 2023-07-20 LAB — LIPID PANEL
Cholesterol: 171 mg/dL (ref 0–200)
HDL: 55 mg/dL (ref >40)
LDL Cholesterol: 98 mg/dL (ref 0–99)
Total CHOL/HDL Ratio: 3.1 RATIO
Triglycerides: 89 mg/dL (ref <150)
VLDL: 18 mg/dL (ref 0–40)

## 2023-07-20 LAB — HEPATITIS C ANTIBODY: HCV Ab: NONREACTIVE

## 2023-07-21 LAB — MISC LABCORP TEST (SEND OUT): Labcorp test code: 330015

## 2023-07-21 LAB — HEMOGLOBIN A1C
Hgb A1c MFr Bld: 5.2 % (ref 4.8–5.6)
Mean Plasma Glucose: 103 mg/dL

## 2023-09-08 ENCOUNTER — Other Ambulatory Visit: Payer: Self-pay | Admitting: Family Medicine

## 2023-09-08 DIAGNOSIS — N938 Other specified abnormal uterine and vaginal bleeding: Secondary | ICD-10-CM

## 2023-09-15 ENCOUNTER — Other Ambulatory Visit: Payer: Self-pay | Admitting: Family Medicine

## 2023-09-15 ENCOUNTER — Ambulatory Visit
Admission: RE | Admit: 2023-09-15 | Discharge: 2023-09-15 | Disposition: A | Discharge status: Home / Self Care | Payer: Medicaid Other | Source: Ambulatory Visit | Attending: Family Medicine | Admitting: Family Medicine

## 2023-09-15 DIAGNOSIS — N938 Other specified abnormal uterine and vaginal bleeding: Secondary | ICD-10-CM

## 2023-09-22 ENCOUNTER — Emergency Department
Admission: EM | Admit: 2023-09-22 | Discharge: 2023-09-22 | Disposition: A | Discharge status: Home / Self Care | Payer: Medicaid Other | Attending: Emergency Medicine | Admitting: Emergency Medicine

## 2023-09-22 DIAGNOSIS — N938 Other specified abnormal uterine and vaginal bleeding: Secondary | ICD-10-CM | POA: Diagnosis not present

## 2023-09-22 DIAGNOSIS — F419 Anxiety disorder, unspecified: Secondary | ICD-10-CM | POA: Insufficient documentation

## 2023-09-22 DIAGNOSIS — N939 Abnormal uterine and vaginal bleeding, unspecified: Secondary | ICD-10-CM | POA: Diagnosis present

## 2023-09-22 LAB — CBC
HCT: 41.9 % (ref 36.0–46.0)
Hemoglobin: 12.3 g/dL (ref 12.0–15.0)
MCH: 23.1 pg — ABNORMAL LOW (ref 26.0–34.0)
MCHC: 29.4 g/dL — ABNORMAL LOW (ref 30.0–36.0)
MCV: 78.6 fL — ABNORMAL LOW (ref 80.0–100.0)
Platelets: 349 10*3/uL (ref 150–400)
RBC: 5.33 MIL/uL — ABNORMAL HIGH (ref 3.87–5.11)
RDW: 16.3 % — ABNORMAL HIGH (ref 11.5–15.5)
WBC: 10 10*3/uL (ref 4.0–10.5)
nRBC: 0 % (ref 0.0–0.2)

## 2023-09-22 LAB — COMPREHENSIVE METABOLIC PANEL
ALT: 21 U/L (ref 0–44)
AST: 28 U/L (ref 15–41)
Albumin: 4.2 g/dL (ref 3.5–5.0)
Alkaline Phosphatase: 107 U/L (ref 38–126)
Anion gap: 9 (ref 5–15)
BUN: 12 mg/dL (ref 6–20)
CO2: 24 mmol/L (ref 22–32)
Calcium: 9.3 mg/dL (ref 8.9–10.3)
Chloride: 101 mmol/L (ref 98–111)
Creatinine, Ser: 0.79 mg/dL (ref 0.44–1.00)
GFR, Estimated: >60 mL/min (ref >60)
Glucose, Bld: 98 mg/dL (ref 70–99)
Potassium: 4.3 mmol/L (ref 3.5–5.1)
Sodium: 134 mmol/L — ABNORMAL LOW (ref 135–145)
Total Bilirubin: 0.5 mg/dL (ref 0.3–1.2)
Total Protein: 8.3 g/dL — ABNORMAL HIGH (ref 6.5–8.1)

## 2023-09-22 LAB — HCG, QUANTITATIVE, PREGNANCY: hCG, Beta Chain, Quant, S: <1 m[IU]/mL (ref <5)

## 2023-09-22 MED ORDER — ACETAMINOPHEN 500 MG PO TABS
1000.0000 mg | ORAL_TABLET | Freq: Once | ORAL | Status: AC
  Administered 2023-09-22: 1000 mg via ORAL
  Filled 2023-09-22: qty 2

## 2023-09-22 MED ORDER — MEDROXYPROGESTERONE ACETATE 10 MG PO TABS: 10.0000 mg | ORAL_TABLET | Freq: Every day | ORAL | 0 refills | Status: AC

## 2023-09-22 NOTE — ED Triage Notes (Signed)
Pt reports last months she had heavy vaginal bleeding that lasted almost a month. Was given oral medication to slow bleeding. Pt states that she was without vaginal bleeding for approximately 1-2 weeks. Approximately ten days ago she began having vaginal bleeding again, becoming heavier and today she has been soaking through her heavy tampons in less than an hour. Pt denies SOB or dizziness.

## 2023-09-22 NOTE — ED Notes (Signed)
Tech at bedside attempting to obtain labs.

## 2023-09-22 NOTE — ED Notes (Signed)
Per first RN, lab called and stated this pt green top was hemolyzed. Pt has a 22g in the hand that will not pull back. Attempted to straight stick pt but unable to find any suitable veins. Will let another staff member attempt.

## 2023-09-22 NOTE — ED Provider Notes (Addendum)
[facility] Provider Note    Event Date/Time   First MD Initiated Contact with Patient 09/22/23 1335     (approximate)   History   Vaginal Bleeding   HPI  Christy Robinson is a 32 y.o. female comes in with concerns for dysfunctional vaginal bleeding.  Patient reports that she had heavy bleeding last month and was started on an oral medication that slowed the bleeding.  She was able to not have any bleeding for about 1 to 2 weeks but then it restarted 10 days ago in the past 2 days she reports has been very significant soaking more than a pad an hour.  She denies any shortness of breath, dizziness, lightheadedness.  She states that she is not sexually active and has never had a pelvic exam done.  She reports having a lot of anxiety with pelvic exams.  She does report that she uses a tampon and pad and has to change it very frequently secondary to the recurrent bleeding.   Physical Exam   Triage Vital Signs: ED Triage Vitals [09/22/23 1241]  Encounter Vitals Group     BP (!) 146/101     Systolic BP Percentile      Diastolic BP Percentile      Pulse Rate 94     Resp 16     Temp 98 F (36.7 C)     Temp Source Oral     SpO2 100 %     Weight      Height      Head Circumference      Peak Flow      Pain Score 6     Pain Loc      Pain Education      Exclude from Growth Chart     Most recent vital signs: Vitals:   09/22/23 1241  BP: (!) 146/101  Pulse: 94  Resp: 16  Temp: 98 F (36.7 C)  SpO2: 100%     General: Awake, no distress.  CV:  Good peripheral perfusion.  Resp:  Normal effort.  Abd:  No distention.  Soft and nontender Other:     ED Results / Procedures / Treatments   Labs (all labs ordered are listed, but only abnormal results are displayed) Labs Reviewed  CBC - Abnormal; Notable for the following components:      Result Value   RBC 5.33 (*)    MCV 78.6 (*)    MCH 23.1 (*)    MCHC 29.4 (*)    RDW 16.3 (*)    All other  components within normal limits  COMPREHENSIVE METABOLIC PANEL  HCG, QUANTITATIVE, PREGNANCY  POC URINE PREG, ED  TYPE AND SCREEN  TYPE AND SCREEN      RADIOLOGY I have reviewed the ultrasound personally interpreted and no evidence of any obvious fibroid  IMPRESSION: Endometrial thickness within normal limits. If bleeding remains unresponsive to hormonal or medical therapy, sonohysterogram should be considered for focal lesion work-up. (Ref: Radiological Reasoning: Algorithmic Workup of Abnormal Vaginal Bleeding with Endovaginal Sonography and Sonohysterography. AJR 2008; 161:W96-04).   2.5 cm right ovarian cyst. No followup imaging recommended. Note: This recommendation does not apply to premenarchal patients or to those with increased risk (genetic, family history, elevated tumor markers or other high-risk factors) of ovarian cancer. Reference: Radiology 2019 Nov; 293(2):359-371.  PROCEDURES:  Critical Care performed: No  Procedures   MEDICATIONS ORDERED IN ED: Medications - No data to display   IMPRESSION /  MDM / ASSESSMENT AND PLAN / ED COURSE  I reviewed the triage vital signs and the nursing notes.   Patient's presentation is most consistent with acute presentation with potential threat to life or bodily function.   Patient comes in with abdominal cramping vaginal bleeding had ultrasound done on the 10th but no read.  Given patient's difficulties with getting ultrasounds due to her not being sexually active and really wanting to try to avoid any type of exam today will discuss with radiology to try to get a read on this ultrasound from the 10th.  Do not feel that this represents ovarian torsion.  preg test ordered but patient reports that she is not sexually active.  We discussed pelvic exam to see about how much bleeding she is having to help quantify it and patient declined stating she does not feel comfortable with having a pelvic exam done.  Labs show normal  hemoglobin which is reassuring given this been ongoing for the past few days.  CMP reassuring.  Pregnancy test negative  IMPRESSION: Endometrial thickness within normal limits. If bleeding remains unresponsive to hormonal or medical therapy, sonohysterogram should be considered for focal lesion work-up. (Ref: Radiological Reasoning: Algorithmic Workup of Abnormal Vaginal Bleeding with Endovaginal Sonography and Sonohysterography. AJR 2008; 981:X91-47).   2.5 cm right ovarian cyst. No followup imaging recommended. Note: This recommendation does not apply to premenarchal patients or to those with increased risk (genetic, family history, elevated tumor markers or other high-risk factors) of ovarian cancer. Reference: Radiology 2019 Nov; 293(2):359-371.   Given the cyst I discussed with patient she is not having any severe right lower quadrant pain low suspicion for ovarian torsion we discussed return precautions in regards to this but how this is very unlikely with the size of cyst.  Given no significant pain do not feel we need to repeat ultrasound today.  Her symptoms are more of just a generalized cramping most likely related to the vaginal bleeding.  D/w Doreene Burke from [facility] who recommended trial provera 10mg  10 days ago.  And following up in the office for more definitive treatment.  Pt denies any smoking, h/o blood clots, or risk factors for blood clots.     FINAL CLINICAL IMPRESSION(S) / ED DIAGNOSES   Final diagnoses:  Vaginal bleeding  Dysfunctional uterine bleeding     Rx / DC Orders   ED Discharge Orders          Ordered    medroxyPROGESTERone (PROVERA) 10 MG tablet  Daily        09/22/23 1420             Note:  This document was prepared using Dragon voice recognition software and may include unintentional dictation errors.   Concha Se, MD 09/22/23 1430    Concha Se, MD 09/22/23 [phone]

## 2023-09-22 NOTE — Discharge Instructions (Addendum)
These call the OB team to make a follow-up appointment for your abnormal bleeding to discuss more long-term management.  Please state that you were seen in the ER need an ER follow-up in the meantime we represcribed the Provera to help with the bleeding.  Return to the ER if develop more severe bleeding, weakness, lightheadedness, severe pain on one side of your abdomen or any other concerns

## 2023-09-24 NOTE — Plan of Care (Signed)
[facility] Tonsillectomy/Adenoidectomy, Postoperative PEDS care plan entered in error.

## 2023-09-30 ENCOUNTER — Encounter: Payer: Self-pay | Admitting: Certified Nurse Midwife

## 2023-09-30 ENCOUNTER — Ambulatory Visit (INDEPENDENT_AMBULATORY_CARE_PROVIDER_SITE_OTHER): Payer: Medicaid Other | Admitting: Certified Nurse Midwife

## 2023-09-30 VITALS — BP 126/84 | HR 78 | Wt 302.8 lb

## 2023-09-30 DIAGNOSIS — M549 Dorsalgia, unspecified: Secondary | ICD-10-CM | POA: Insufficient documentation

## 2023-09-30 DIAGNOSIS — N939 Abnormal uterine and vaginal bleeding, unspecified: Secondary | ICD-10-CM | POA: Insufficient documentation

## 2023-09-30 DIAGNOSIS — E669 Obesity, unspecified: Secondary | ICD-10-CM | POA: Insufficient documentation

## 2023-09-30 DIAGNOSIS — F419 Anxiety disorder, unspecified: Secondary | ICD-10-CM | POA: Insufficient documentation

## 2023-09-30 DIAGNOSIS — F319 Bipolar disorder, unspecified: Secondary | ICD-10-CM

## 2023-09-30 DIAGNOSIS — F32A Depression, unspecified: Secondary | ICD-10-CM | POA: Insufficient documentation

## 2023-09-30 MED ORDER — OXYCODONE-ACETAMINOPHEN 5-325 MG PO TABS: 1.0000 | ORAL_TABLET | ORAL | 0 refills | Status: AC | PRN

## 2023-09-30 NOTE — Patient Instructions (Signed)

## 2023-09-30 NOTE — Progress Notes (Signed)
GYN ENCOUNTER NOTE  Subjective:       Christy Robinson is a 32 y.o. No obstetric history on file. female is here for gynecologic evaluation of the following issues:  1. Follow up from ED for AUB.  Pt state she has regular periods that have always been painful and some what heavy , but over the past 2 months they have become very heavy. She is using tampons and pads that she is having to change every few hours. She denies any previous diagnosis of endometriosis. She states she believes her mother had it.    Gynecologic History No LMP recorded. Contraception: none Last Pap: has not had one ([place]). Last mammogram: n/a.   Obstetric History OB History  No obstetric history on file.    Past Medical History:  Diagnosis Date   Anxiety    Depression     Past Surgical History:  Procedure Laterality Date   TONSILLECTOMY      Current Outpatient Medications on File Prior to Visit  Medication Sig Dispense Refill   busPIRone (BUSPAR) 5 MG tablet Take 5 mg by mouth.     Cholecalciferol (VITAMIN D) 50 MCG (2000 UT) CAPS      diclofenac (VOLTAREN) 75 MG EC tablet Take by oral route for 30 days.     FEROSUL 325 (65 Fe) MG tablet Take 325 mg by mouth 3 (three) times a week.     FLUoxetine (PROZAC) 20 MG capsule TAKE 1 CAPUSLE BY MOUTH ONCE DAILY IN THE MORNING. 30 capsule 1   medroxyPROGESTERone (PROVERA) 10 MG tablet Take 1 tablet (10 mg total) by mouth daily for 10 days. 10 tablet 0   meloxicam (MOBIC) 15 MG tablet Take 1 tablet (15 mg total) by mouth daily. 30 tablet 0   methocarbamol (ROBAXIN) 500 MG tablet TAKE 1 TABLET BY MOUTH THREE TIMES DAILY AS NEEDED FOR MUSCLE PAIN. MAY CAUSE DROWSINESS.     ondansetron (ZOFRAN-ODT) 4 MG disintegrating tablet Take 1 tablet (4 mg total) by mouth every 8 (eight) hours as needed for nausea or vomiting. 15 tablet 0   propranolol (INDERAL) 10 MG tablet Take 10 mg by mouth 3 (three) times daily as needed.     REXULTI 1 MG TABS tablet Take 1 tablet by  mouth at bedtime.     VENTOLIN HFA 108 (90 Base) MCG/ACT inhaler INHALE 2 PUFFS BY MOUTH EVERY 4 TO 6 HOURS AS NEEDED FOR SHORTNESS OF BREATH     No current facility-administered medications on file prior to visit.    Allergies  Allergen Reactions   Augmentin [Amoxicillin-Pot Clavulanate] Hives    Social History   Socioeconomic History   Marital status: Single    Spouse name: Not on file   Number of children: Not on file   Years of education: Not on file   Highest education level: Not on file  Occupational History   Not on file  Tobacco Use   Smoking status: Never   Smokeless tobacco: Never  Vaping Use   Vaping status: Never Used  Substance and Sexual Activity   Alcohol use: No   Drug use: No   Sexual activity: Not Currently  Other Topics Concern   Not on file  Social History Narrative   Not on file   Social Determinants of Health   Financial Resource Strain: Not on file  Food Insecurity: Not on file  Transportation Needs: Not on file  Physical Activity: Not on file  Stress: Not on file  Social  Connections: Not on file  Intimate Partner Violence: Not on file    No family history on file.  The following portions of the patient's history were reviewed and updated as appropriate: allergies, current medications, past family history, past medical history, past social history, past surgical history and problem list.  Review of Systems Review of Systems - Negative except as mentioned in HPI Review of Systems - General ROS: negative for - chills, fatigue, fever, hot flashes, malaise or night sweats Hematological and Lymphatic ROS: negative for - bleeding problems or swollen lymph nodes Gastrointestinal ROS: negative for - abdominal pain, blood in stools, change in bowel habits and nausea/vomiting Musculoskeletal ROS: negative for - joint pain, muscle pain or muscular weakness Genito-Urinary ROS: negative for - change in menstrual cycle, dysmenorrhea, dyspareunia,  dysuria, genital discharge, genital ulcers, hematuria, incontinence, irregular menses, nocturia or pelvic pain. Positive for heavy bleeding x 2 months.   Objective:   BP 126/84   Pulse 78   Wt (!) 302 lb 12.8 oz (137.3 kg)   BMI 48.87 kg/m  CONSTITUTIONAL: Well-developed, well-nourished , obese female in no acute distress.  HENT:  Normocephalic, atraumatic.  NECK: Normal range of motion, supple, no masses.  Normal thyroid.  SKIN: Skin is warm and dry. No rash noted. Not diaphoretic. No erythema. No pallor. NEUROLGIC: Alert and oriented to person, place, and time. PSYCHIATRIC: Normal mood and affect. Normal behavior. Normal judgment and thought content. CARDIOVASCULAR:Not Examined RESPIRATORY: Not Examined BREASTS: Not Examined ABDOMEN: Soft, non distended; Non tender.  No Organomegaly. PELVIC: not indicated given pt had recent u/s.See below MUSCULOSKELETAL: Normal range of motion. No tenderness.  No cyanosis, clubbing, or edema.  : TRANSABDOMINAL ULTRASOUND OF PELVIS   TECHNIQUE: Transabdominal ultrasound examination of the pelvis was performed including evaluation of the uterus, ovaries, adnexal regions, and pelvic cul-de-sac. Patient declined transvaginal imaging.   COMPARISON:  None Available.   FINDINGS: Uterus   Measurements: 9.1 x 5.6 x 3.9 cm = volume: 104 mL. No fibroids or other mass visualized.   Endometrium   Thickness: 11 mm which is within normal limits. No focal abnormality visualized.   Right ovary Measurements: 3.5 x 3.0 x 2.7 cm = volume: 15 mL. 2.5 cm cyst is noted.   Left ovary Not visualized.   Other findings:  No abnormal free fluid.   IMPRESSION: Endometrial thickness within normal limits. If bleeding remains unresponsive to hormonal or medical therapy, sonohysterogram should be considered for focal lesion work-up. (Ref: Radiological Reasoning: Algorithmic Workup of Abnormal Vaginal Bleeding with Endovaginal Sonography and  Sonohysterography. AJR 2008; 161:W96-04).   2.5 cm right ovarian cyst. No followup imaging recommended. Note: This recommendation does not apply to premenarchal patients or to those with increased risk (genetic, family history, elevated tumor markers or other high-risk factors) of ovarian cancer. Reference: Radiology 2019 Nov; 293(2):359-371.     Electronically Signed   By: Lupita Raider M.D.   On: 09/22/2023 13:45     Assessment:  Abnormal uterine bleeding    Plan:   Discussed possibility of endometriosis and diagnosis of endometriosis. Discussed treatment options to control cycle reviewed Birth control options including, pill, nexplanon, depo injections and IUD. Discussed Mirena IUD as best bleeding profile. Reviewed procedure. Pt would like to have this done. She state she has poor pain tolerance and anxiety. Orders placed for percocet . Pt instructed to take 800 mg Ibuprofen and 1 percocet prior to procedure. Also advised she have someone drive her ( since she will be  taking a narcotic). She agrees. She is currently on provera. Pt encouraged to continue full course. Follow up prn for IUD placement.   Doreene Burke, CNM

## 2023-10-10 ENCOUNTER — Ambulatory Visit (INDEPENDENT_AMBULATORY_CARE_PROVIDER_SITE_OTHER): Payer: Medicaid Other | Admitting: Certified Nurse Midwife

## 2023-10-10 ENCOUNTER — Encounter: Payer: Self-pay | Admitting: Certified Nurse Midwife

## 2023-10-10 VITALS — BP 138/81 | HR 92 | Ht 67.0 in | Wt 301.5 lb

## 2023-10-10 DIAGNOSIS — Z3202 Encounter for pregnancy test, result negative: Secondary | ICD-10-CM

## 2023-10-10 DIAGNOSIS — Z30430 Encounter for insertion of intrauterine contraceptive device: Secondary | ICD-10-CM

## 2023-10-10 LAB — POCT URINE PREGNANCY: Preg Test, Ur: NEGATIVE

## 2023-10-10 MED ORDER — LEVONORGESTREL 20 MCG/DAY IU IUD
1.0000 | INTRAUTERINE_SYSTEM | Freq: Once | INTRAUTERINE | Status: AC
Start: 2023-10-10 — End: 2023-10-10
  Administered 2023-10-10: 1 via INTRAUTERINE

## 2023-10-10 NOTE — Progress Notes (Signed)
   GYNECOLOGY OFFICE PROCEDURE NOTE  WINFRED REDEL is a 32 y.o. No obstetric history on file. here for Mirena IUD insertion. No GYN concerns.  Last pap smear has not had.  IUD Insertion Procedure Note Patient identified, informed consent performed, consent signed.   Discussed risks of irregular bleeding, cramping, infection, malpositioning or misplacement of the IUD outside the uterus which may require further procedure such as laparoscopy. Also discussed >99% contraception efficacy, increased risk of ectopic pregnancy with failure of method.   Emphasized that this did not protect against STIs, condoms recommended during all sexual encounters. Time out was performed.  Chaperone present.  Urine pregnancy test negative.  Speculum placed in the vagina.  Cervix visualized.  Cleaned with Betadine x 2.  Grasped anteriorly with a single tooth tenaculum.  Uterus sounded to 7 cm.  Mirena IUD placed per manufacturer's recommendations.  Strings trimmed to 3 cm. Tenaculum was removed, good hemostasis noted.  Patient tolerated procedure well.   Patient was given post-procedure instructions.  She was advised to have backup contraception for one week.  Patient was also asked to check IUD strings periodically and follow up in 4 weeks for IUD check.   Doreene Burke, CNM

## 2023-10-10 NOTE — Patient Instructions (Signed)
IUD PLACEMENT POST-PROCEDURE INSTRUCTIONS ° °You may take Ibuprofen, Aleve or Tylenol for pain if needed.  Cramping should resolve within in 24 hours. ° °You may have a small amount of spotting.  You should wear a mini pad for the next few days. ° °You may have intercourse after 24 hours.  If you using this for birth control, it is effective immediately. ° °You need to call if you have any pelvic pain, fever, heavy bleeding or foul smelling vaginal discharge.  Irregular bleeding is common the first several months after having an IUD placed. You do not need to call for this reason unless you are concerned. ° °Shower or bathe as normal ° °You should have a follow-up appointment in 4-8 weeks for a re-check to make sure you are not having any problems. °

## 2023-10-18 ENCOUNTER — Telehealth: Payer: Self-pay

## 2023-10-18 NOTE — Telephone Encounter (Signed)
Pt calling; has questions about her IUD she had inserted 11/4th; she was told she would probably spot a few days after the insertion; she states that now it is more like a period.  Adv per protocol that abnormal uterine bleeding is common after IUD insertion; especially the first three months.  Pt reassured.

## 2023-11-10 ENCOUNTER — Other Ambulatory Visit (HOSPITAL_COMMUNITY)
Admission: RE | Admit: 2023-11-10 | Discharge: 2023-11-10 | Disposition: A | Discharge status: Home / Self Care | Payer: Medicaid Other | Source: Ambulatory Visit | Attending: Certified Nurse Midwife | Admitting: Certified Nurse Midwife

## 2023-11-10 ENCOUNTER — Encounter: Payer: Self-pay | Admitting: Certified Nurse Midwife

## 2023-11-10 ENCOUNTER — Ambulatory Visit: Payer: Medicaid Other | Admitting: Certified Nurse Midwife

## 2023-11-10 VITALS — BP 125/85 | HR 83 | Ht 67.0 in | Wt 305.0 lb

## 2023-11-10 DIAGNOSIS — Z23 Encounter for immunization: Secondary | ICD-10-CM

## 2023-11-10 DIAGNOSIS — Z01419 Encounter for gynecological examination (general) (routine) without abnormal findings: Secondary | ICD-10-CM | POA: Diagnosis present

## 2023-11-10 DIAGNOSIS — Z124 Encounter for screening for malignant neoplasm of cervix: Secondary | ICD-10-CM | POA: Insufficient documentation

## 2023-11-10 NOTE — Progress Notes (Signed)
GYNECOLOGY ANNUAL PREVENTATIVE CARE ENCOUNTER NOTE  History:     Christy Robinson is a 32 y.o. No obstetric history on file. female here for a routine annual gynecologic exam.  Current complaints: none, bleeding has improved with IUD.   Denies abnormal vaginal bleeding, discharge, pelvic pain, problems with intercourse or other gynecologic concerns.     Social Relationship: single  Living: with family  Work: disability  Exercise: physical therapy 2 x wk x 1 hr  Smoke/Alcohol/drug use: denies use   Gynecologic History No LMP recorded (within months). Contraception: IUD Last Pap: virgin/has not had . Last mammogram: n/a .  Upstream - 11/10/23 1313       Pregnancy Intention Screening   Does the patient want to become pregnant in the next year? No    Does the patient's partner want to become pregnant in the next year? No    Would the patient like to discuss contraceptive options today? No      Contraception Wrap Up   Current Method IUD or IUS    End Method IUD or IUS    Contraception Counseling Provided No            The pregnancy intention screening data noted above was reviewed. Potential methods of contraception were discussed. The patient elected to proceed with IUD or IUS.   Obstetric History OB History  No obstetric history on file.    Past Medical History:  Diagnosis Date   Anxiety    Depression     Past Surgical History:  Procedure Laterality Date   TONSILLECTOMY      Current Outpatient Medications on File Prior to Visit  Medication Sig Dispense Refill   busPIRone (BUSPAR) 5 MG tablet Take 5 mg by mouth.     Cholecalciferol (VITAMIN D) 50 MCG (2000 UT) CAPS      diclofenac (VOLTAREN) 75 MG EC tablet Take by oral route for 30 days.     FEROSUL 325 (65 Fe) MG tablet Take 325 mg by mouth 3 (three) times a week.     FLUoxetine (PROZAC) 20 MG capsule TAKE 1 CAPUSLE BY MOUTH ONCE DAILY IN THE MORNING. 30 capsule 1   methocarbamol (ROBAXIN)  500 MG tablet TAKE 1 TABLET BY MOUTH THREE TIMES DAILY AS NEEDED FOR MUSCLE PAIN. MAY CAUSE DROWSINESS.     ondansetron (ZOFRAN-ODT) 4 MG disintegrating tablet Take 1 tablet (4 mg total) by mouth every 8 (eight) hours as needed for nausea or vomiting. 15 tablet 0   propranolol (INDERAL) 10 MG tablet Take 10 mg by mouth 3 (three) times daily as needed.     REXULTI 1 MG TABS tablet Take 1 tablet by mouth at bedtime.     VENTOLIN HFA 108 (90 Base) MCG/ACT inhaler INHALE 2 PUFFS BY MOUTH EVERY 4 TO 6 HOURS AS NEEDED FOR SHORTNESS OF BREATH     medroxyPROGESTERone (PROVERA) 10 MG tablet Take 1 tablet (10 mg total) by mouth daily for 10 days. 10 tablet 0   No current facility-administered medications on file prior to visit.    Allergies  Allergen Reactions   Augmentin [Amoxicillin-Pot Clavulanate] Hives    Social History:  reports that she has never smoked. She has never used smokeless tobacco. She reports that she does not drink alcohol and does not use drugs.  History reviewed. No pertinent family history.  The following portions of the patient's history were reviewed and updated as appropriate: allergies, current medications, past family  history, past medical history, past social history, past surgical history and problem list.  Review of Systems Pertinent items noted in HPI and remainder of comprehensive ROS otherwise negative.  Physical Exam:  BP 125/85   Pulse 83   Ht 5\' 7"  (1.702 m)   Wt (!) 305 lb (138.3 kg)   LMP  (Within Months)   BMI 47.77 kg/m  CONSTITUTIONAL: Well-developed, well-nourished female in no acute distress.  HENT:  Normocephalic, atraumatic, External right and left ear normal. Oropharynx is clear and moist EYES: Conjunctivae and EOM are normal. Pupils are equal, round, and reactive to light. No scleral icterus.  NECK: Normal range of motion, supple, no masses.  Normal thyroid.  SKIN: Skin is warm and dry. No rash noted. Not diaphoretic. No erythema. No  pallor. MUSCULOSKELETAL: Normal range of motion. No tenderness.  No cyanosis, clubbing, or edema.  2+ distal pulses. NEUROLOGIC: Alert and oriented to person, place, and time. Normal reflexes, muscle tone coordination.  PSYCHIATRIC: Normal mood and affect. Normal behavior. Normal judgment and thought content. CARDIOVASCULAR: Normal heart rate noted, regular rhythm RESPIRATORY: Clear to auscultation bilaterally. Effort and breath sounds normal, no problems with respiration noted. BREASTS: Symmetric in size. No masses, tenderness, skin changes, nipple drainage, or lymphadenopathy bilaterally.  ABDOMEN: Soft, no distention noted.  No tenderness, rebound or guarding.  PELVIC: Normal appearing external genitalia and urethral meatus; normal appearing vaginal mucosa and cervix.  No abnormal discharge noted.  Pap smear obtained.  Contact bleeding. String present. Normal uterine size, no other palpable masses, no uterine or adnexal tenderness.  .   Assessment and Plan:    1. Women's annual routine gynecological examination   Pap: Will follow up results of pap smear and manage accordingly. Mammogram : n/a  Labs: declines Tdap/Flu given today Refills: none  Referral: Routine preventative health maintenance measures emphasized. Please refer to After Visit Summary for other counseling recommendations.      Doreene Burke, CNM [facility]  [facility],  [facility]

## 2023-11-10 NOTE — Patient Instructions (Signed)

## 2023-11-16 LAB — CYTOLOGY - PAP
Comment: NEGATIVE
Diagnosis: NEGATIVE
High risk HPV: NEGATIVE

## 2024-05-04 IMAGING — CR DG THORACIC SPINE 2V
3 series · 3 of 3 positions shown · non-contrast
Comparison: None Available.

CLINICAL DATA: pain/ no known injury

EXAM:
THORACIC SPINE 2 VIEWS

[t-spine ap]
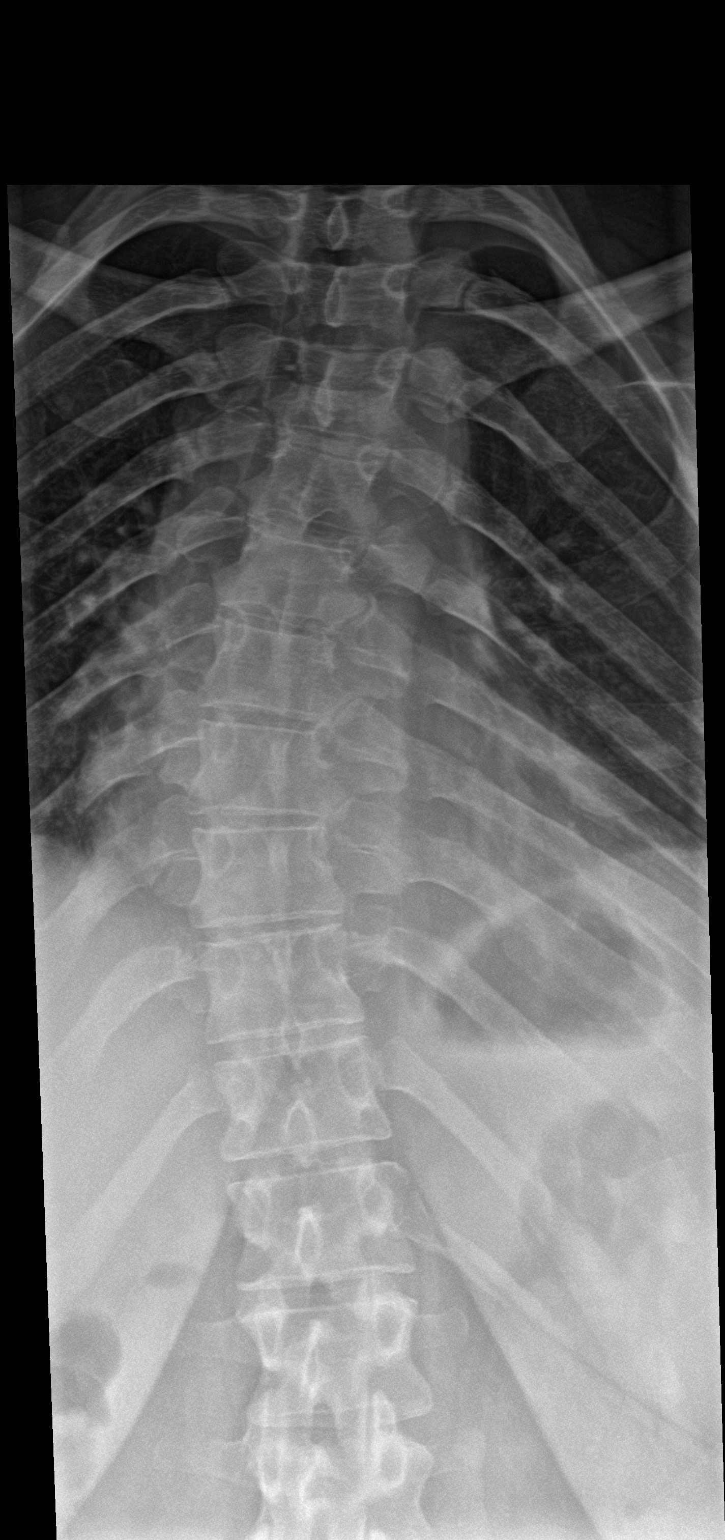

[t-spine lat]
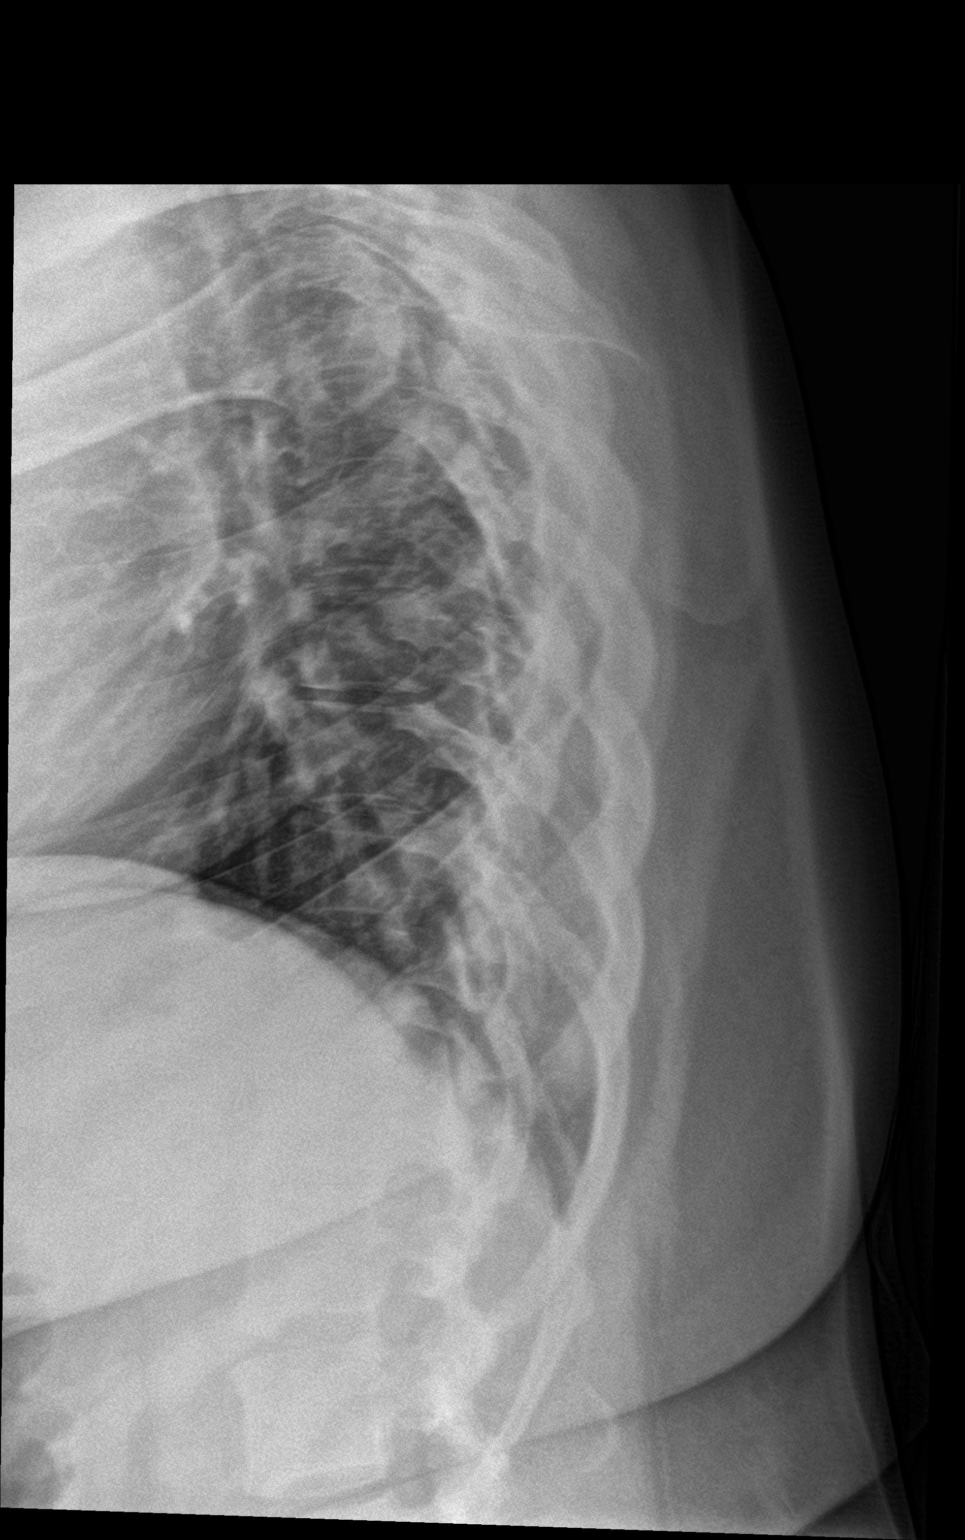

[t-spine swimmers]
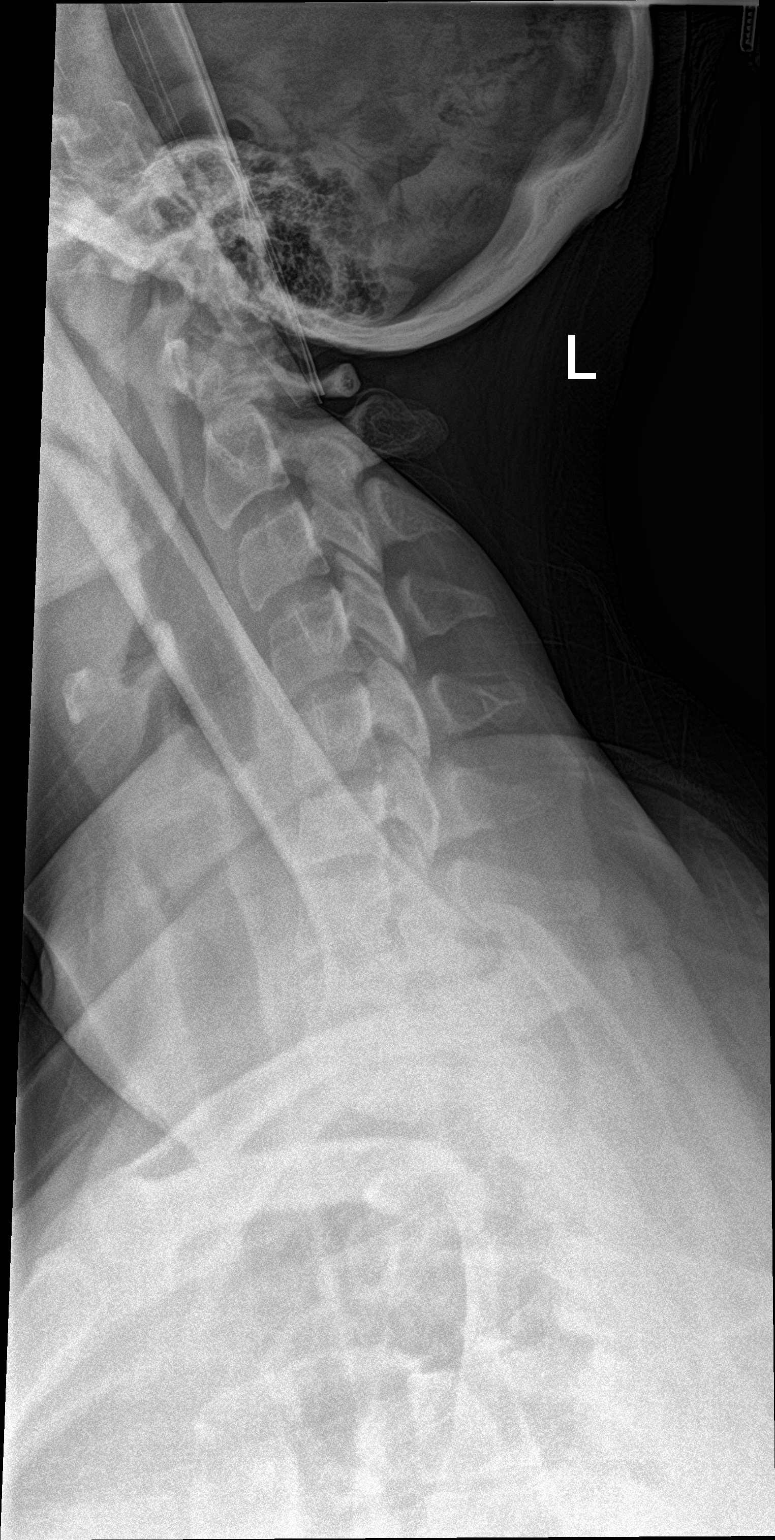

[3 of 3 positions shown; findings below may reference images not displayed]

FINDINGS: In when accounting for obliquity, vertebral body heights are
maintained. No evidence of acute fracture. No substantial sagittal
subluxation. Rotatory dextrocurvature of the thoracic spine.
IMPRESSION: 1. No evidence of acute fracture.
2. Rotatory dextrocurvature.

## 2024-12-08 ENCOUNTER — Emergency Department

## 2024-12-08 ENCOUNTER — Emergency Department
Admission: EM | Admit: 2024-12-08 | Discharge: 2024-12-08 | Disposition: A | Discharge status: Home / Self Care | Attending: Emergency Medicine | Admitting: Emergency Medicine

## 2024-12-08 ENCOUNTER — Other Ambulatory Visit: Payer: Self-pay

## 2024-12-08 DIAGNOSIS — G8929 Other chronic pain: Secondary | ICD-10-CM | POA: Insufficient documentation

## 2024-12-08 DIAGNOSIS — M545 Low back pain, unspecified: Secondary | ICD-10-CM | POA: Diagnosis present

## 2024-12-08 LAB — URINALYSIS, ROUTINE W REFLEX MICROSCOPIC
Bacteria, UA: NONE SEEN
Bilirubin Urine: NEGATIVE
Glucose, UA: NEGATIVE mg/dL
Hgb urine dipstick: NEGATIVE
Ketones, ur: NEGATIVE mg/dL
Nitrite: NEGATIVE
Protein, ur: NEGATIVE mg/dL
Specific Gravity, Urine: 1.02 (ref 1.005–1.030)
pH: 5 (ref 5.0–8.0)

## 2024-12-08 LAB — POC URINE PREG, ED: Preg Test, Ur: NEGATIVE

## 2024-12-08 MED ORDER — KETOROLAC TROMETHAMINE 15 MG/ML IJ SOLN
15.0000 mg | Freq: Once | INTRAMUSCULAR | Status: AC
  Administered 2024-12-08: 15 mg via INTRAMUSCULAR
  Filled 2024-12-08: qty 1

## 2024-12-08 MED ORDER — LIDOCAINE 5 % EX PTCH
1.0000 | MEDICATED_PATCH | CUTANEOUS | Status: DC
  Administered 2024-12-08: 1 via TRANSDERMAL
  Filled 2024-12-08: qty 1

## 2024-12-08 MED ORDER — ACETAMINOPHEN 325 MG PO TABS
650.0000 mg | ORAL_TABLET | Freq: Once | ORAL | Status: AC
  Administered 2024-12-08: 650 mg via ORAL
  Filled 2024-12-08: qty 2

## 2024-12-08 MED ORDER — METHOCARBAMOL 500 MG PO TABS: 500.0000 mg | ORAL_TABLET | Freq: Two times a day (BID) | ORAL | 0 refills | Status: AC | PRN

## 2024-12-08 NOTE — ED Triage Notes (Signed)
 Pt to ED via [facility] from home. Pt reports got up around 4am to use restroom. Pt reports had to reach behind to get toilet paper and had intense pain to LL back that radiated to right. Pt took muscle relaxer with no relief. Pt with hx chronic back issues.

## 2024-12-08 NOTE — ED Provider Notes (Signed)
 "  [facility] Provider Note    Event Date/Time   First MD Initiated Contact with Patient 12/08/24 [phone]     (approximate)   History   Back Pain   HPI  Christy Robinson is a 34 y.o. female with a history of chronic back pain who presents today for evaluation of back pain.  Patient reports that she normally gets epidural steroid injections, but missed her last 1 because of insurance complications.  Patient reports that she reached behind her to grab the toilet paper this morning at approximately 420 in the morning when she was sitting on the toilet to urinate, and then felt a sharp pain across her low back.  She denies any radiation of pain into her legs.  She denies any urinary or fecal incontinence or retention.  She is still able to ambulate.  She reports that she has significant pain across her low back when the same location that she typically does.  She has an appointment this month with her spine doctors.  She reports that she took her methocarbamol  but reports that it had been expired for 1 year.  Patient Active Problem List   Diagnosis Date Noted   Abnormal uterine bleeding 09/30/2023   Obesity 09/30/2023   Bipolar 1 disorder (HCC) 09/30/2023   Anxiety 09/30/2023   Depression 09/30/2023   Back pain 09/30/2023          Physical Exam   Triage Vital Signs: ED Triage Vitals  Encounter Vitals Group     BP 12/08/24 0733 (!) 172/105     Girls Systolic BP Percentile --      Girls Diastolic BP Percentile --      Boys Systolic BP Percentile --      Boys Diastolic BP Percentile --      Pulse Rate 12/08/24 0733 90     Resp 12/08/24 0733 18     Temp 12/08/24 0731 97.7 F (36.5 C)     Temp Source 12/08/24 0731 Oral     SpO2 12/08/24 0733 95 %     Weight --      Height --      Head Circumference --      Peak Flow --      Pain Score 12/08/24 0731 10     Pain Loc --      Pain Education --      Exclude from Growth Chart --     Most recent vital  signs: Vitals:   12/08/24 0818 12/08/24 1014  BP:    Pulse:    Resp:    Temp:    SpO2: 95% 95%    Physical Exam Vitals and nursing note reviewed.  Constitutional:      General: Awake and alert. No acute distress.    Appearance: Normal appearance.  HENT:     Head: Normocephalic and atraumatic.     Mouth: Mucous membranes are moist.  Eyes:     General: PERRL. Normal EOMs        Right eye: No discharge.        Left eye: No discharge.     Conjunctiva/sclera: Conjunctivae normal.  Cardiovascular:     Rate and Rhythm: Normal rate and regular rhythm.     Pulses: Normal pulses.  Pulmonary:     Effort: Pulmonary effort is normal. No respiratory distress.     Breath sounds: Normal breath sounds.  Abdominal:     Abdomen is soft. There is no abdominal  tenderness. No rebound or guarding. No distention. Musculoskeletal:        General: No swelling. Normal range of motion.     Cervical back: Normal range of motion and neck supple.  Back: No midline tenderness.  Mild tenderness across the lumbar area.  Strength and sensation 5/5 to bilateral lower extremities. Normal great toe extension against resistance. Normal sensation throughout feet. Normal patellar reflexes. Negative SLR and opposite SLR bilaterally. Negative FABER test.  Normal gait Skin:    General: Skin is warm and dry.     Capillary Refill: Capillary refill takes less than 2 seconds.     Findings: No rash.  Neurological:     Mental Status: The patient is awake and alert.      ED Results / Procedures / Treatments   Labs (all labs ordered are listed, but only abnormal results are displayed) Labs Reviewed  URINALYSIS, ROUTINE W REFLEX MICROSCOPIC - Abnormal; Notable for the following components:      Result Value   Color, Urine YELLOW (*)    APPearance HAZY (*)    Leukocytes,Ua LARGE (*)    All other components within normal limits  POC URINE PREG, ED - Normal     EKG     RADIOLOGY I independently reviewed and  interpreted imaging and agree with radiologists findings.     PROCEDURES:  Critical Care performed:   Procedures   MEDICATIONS ORDERED IN ED: Medications  lidocaine  (LIDODERM ) 5 % 1 patch (1 patch Transdermal Patch Applied 12/08/24 0928)  ketorolac  (TORADOL ) 15 MG/ML injection 15 mg (15 mg Intramuscular Given 12/08/24 0931)  acetaminophen  (TYLENOL ) tablet 650 mg (650 mg Oral Given 12/08/24 0929)     IMPRESSION / MDM / ASSESSMENT AND PLAN / ED COURSE  I reviewed the triage vital signs and the nursing notes.   Differential diagnosis includes, but is not limited to, lumbar strain, spondylolisthesis, lumbar radiculopathy.  I reviewed the patient's chart.  Patient has a known diagnosis of lumbar radiculopathy and lumbar spondylosis and follows with [facility].  Patient is awake and alert, hemodynamically stable and afebrile.  She has normal strength and sensation of bilateral lower extremities. with a history of back pain who presents with back pain. Patient has 5 out of 5 strength with intact sensation to extensor hallucis dorsiflexion and plantarflexion of bilateral lower extremities with normal patellar reflexes bilaterally. Most likely etiology at this point is muscle strain vs spasm. No red flags to indicate patient is at risk for more auspicious process that would require urgent/emergent spinal imaging or subspecialty evaluation at this time.  No radicular symptoms.  No major trauma, no midline tenderness, no history or physical exam findings to suggest cauda equina syndrome or spinal cord compression. No focal neurological deficits on exam. No constitutional symptoms or history of immunosuppression or IVDA to suggest potential for epidural abscess. Not anticoagulated, no history of bleeding diastasis to suggest risk for epidural hematoma. No chronic steroid use or advanced age or history of malignancy to suggest proclivity towards pathological fracture.  No abdominal pain or flank pain to  suggest kidney stone, no history of kidney stone.  No fever or dysuria or CVAT to suggest pyelonephritis.  No chest pain, back pain, shortness of breath, neurological deficits, to suggest vascular catastrophe, and pulses are equal in all 4 extremities.  X-ray lumbar spine obtained in triage reveals no significant abnormality of the lumbar spine.  Patient is reassured by this.  She was treated symptomatically after negative urine pregnancy with  toradol , tylenol , and lidoderm  patch with good effect. Reports pain is 0 afterwards. She did request a refill of her methocarbamol  which was prescribed for her.  She was advised that this medication will make her sleepy and she should not drive, operate heavy machinery, or perform tasks require concentration while taking this medication.  Discussed care instructions and return precautions with patient. Recommended close outpatient follow-up for re-evaluation. Patient agrees with plan of care. Will treat the patient symptomatically as needed for pain control. Will discharge patient to take these medications and return for any worsening or different pain or development of any neurologic symptoms. Educated patient regarding expected time course for back pain to improve and recommended very close outpatient follow-up.  She has an appointment with [facility] later this month, advised that she keep this appointment.  Patient's presentation is most consistent with exacerbation of chronic illness.    FINAL CLINICAL IMPRESSION(S) / ED DIAGNOSES   Final diagnoses:  Chronic right-sided low back pain without sciatica     Rx / DC Orders   ED Discharge Orders          Ordered    methocarbamol  (ROBAXIN ) 500 MG tablet  2 times daily PRN        12/08/24 1012             Note:  This document was prepared using Dragon voice recognition software and may include unintentional dictation errors.   Suhayla Chisom E, PA-C 12/08/24 1416    Arlander Charleston, MD 12/08/24  1510  "

## 2024-12-08 NOTE — Discharge Instructions (Signed)
 Your x-ray was normal.  Your muscle relaxant was refilled for you.  Please remember that this can make you sleepy, so do not drive, operate heavy machinery, or perform tasks require concentration while taking this medication.  Please follow-up with your spine doctor as you have scheduled.  Please return for any new, worsening, or changing symptoms or other concerns.  It was a pleasure caring for you today.
